# Patient Record
Sex: Male | Born: 1950 | Race: White | Hispanic: No | Marital: Married | Smoking: Never smoker
Health system: Southern US, Community
[De-identification: ages and names within clinical notes are randomized; demographics above are authoritative.]

## PROBLEM LIST (undated history)

## (undated) DIAGNOSIS — E785 Hyperlipidemia, unspecified: Secondary | ICD-10-CM

## (undated) DIAGNOSIS — M109 Gout, unspecified: Secondary | ICD-10-CM

## (undated) DIAGNOSIS — K219 Gastro-esophageal reflux disease without esophagitis: Secondary | ICD-10-CM

## (undated) DIAGNOSIS — K635 Polyp of colon: Secondary | ICD-10-CM

## (undated) DIAGNOSIS — M1990 Unspecified osteoarthritis, unspecified site: Secondary | ICD-10-CM

## (undated) DIAGNOSIS — B029 Zoster without complications: Secondary | ICD-10-CM

## (undated) DIAGNOSIS — E7800 Pure hypercholesterolemia, unspecified: Secondary | ICD-10-CM

## (undated) HISTORY — PX: KNEE SURGERY: SHX244

## (undated) HISTORY — PX: COSMETIC SURGERY: SHX468

## (undated) HISTORY — DX: Gout, unspecified: M10.9

## (undated) HISTORY — PX: SHOULDER ARTHROSCOPY: SHX128

## (undated) HISTORY — PX: TONSILLECTOMY: SUR1361

## (undated) HISTORY — DX: Zoster without complications: B02.9

## (undated) HISTORY — DX: Polyp of colon: K63.5

## (undated) HISTORY — DX: Gastro-esophageal reflux disease without esophagitis: K21.9

## (undated) HISTORY — PX: OTHER SURGICAL HISTORY: SHX169

## (undated) HISTORY — DX: Pure hypercholesterolemia, unspecified: E78.00

## (undated) HISTORY — DX: Unspecified osteoarthritis, unspecified site: M19.90

## (undated) HISTORY — PX: KNEE ARTHROSCOPY: SUR90

## (undated) HISTORY — DX: Hyperlipidemia, unspecified: E78.5

---

## 2003-12-20 ENCOUNTER — Ambulatory Visit (HOSPITAL_BASED_OUTPATIENT_CLINIC_OR_DEPARTMENT_OTHER): Admission: RE | Admit: 2003-12-20 | Discharge: 2003-12-20 | Payer: Self-pay | Admitting: Orthopedic Surgery

## 2003-12-20 ENCOUNTER — Ambulatory Visit (HOSPITAL_COMMUNITY): Admission: RE | Admit: 2003-12-20 | Discharge: 2003-12-20 | Payer: Self-pay | Admitting: Orthopedic Surgery

## 2004-08-27 ENCOUNTER — Encounter: Admission: RE | Admit: 2004-08-27 | Discharge: 2004-08-27 | Payer: Self-pay | Admitting: Family Medicine

## 2005-06-11 ENCOUNTER — Encounter: Admission: RE | Admit: 2005-06-11 | Discharge: 2005-06-11 | Payer: Self-pay | Admitting: Family Medicine

## 2006-05-07 ENCOUNTER — Encounter: Admission: RE | Admit: 2006-05-07 | Discharge: 2006-05-07 | Payer: Self-pay | Admitting: Family Medicine

## 2006-06-07 ENCOUNTER — Encounter: Admission: RE | Admit: 2006-06-07 | Discharge: 2006-06-07 | Payer: Self-pay | Admitting: Family Medicine

## 2006-06-09 ENCOUNTER — Encounter: Admission: RE | Admit: 2006-06-09 | Discharge: 2006-06-09 | Payer: Self-pay | Admitting: Family Medicine

## 2008-01-06 ENCOUNTER — Encounter: Admission: RE | Admit: 2008-01-06 | Discharge: 2008-01-06 | Payer: Self-pay | Admitting: Internal Medicine

## 2008-06-26 ENCOUNTER — Encounter: Admission: RE | Admit: 2008-06-26 | Discharge: 2008-06-26 | Payer: Self-pay | Admitting: Internal Medicine

## 2008-07-02 ENCOUNTER — Encounter: Admission: RE | Admit: 2008-07-02 | Discharge: 2008-07-02 | Payer: Self-pay | Admitting: Internal Medicine

## 2008-07-16 ENCOUNTER — Encounter: Admission: RE | Admit: 2008-07-16 | Discharge: 2008-07-16 | Payer: Self-pay | Admitting: Internal Medicine

## 2009-08-01 ENCOUNTER — Encounter: Admission: RE | Admit: 2009-08-01 | Discharge: 2009-08-01 | Payer: Self-pay | Admitting: Sports Medicine

## 2011-03-06 NOTE — Op Note (Signed)
NAME:  Dylan Cortez, Dylan Cortez NO.:  [id number]   MEDICAL RECORD NO.:  [id number]                   PATIENT TYPE:  AMB   LOCATION:  DSC                                  FACILITY:  [facility]   PHYSICIAN:  Nadara Mustard, M.D.                DATE OF BIRTH:  [birth year]   DATE OF PROCEDURE:  12/20/2003  DATE OF DISCHARGE:                                 OPERATIVE REPORT   PREOPERATIVE DIAGNOSES:  1. Left shoulder rotator cuff tear.  2. Impingement syndrome, left shoulder, with hook type 3 acromion.   POSTOPERATIVE DIAGNOSES:  1. Left shoulder rotator cuff tear.  2. Impingement syndrome, left shoulder, with hook type 3 acromion.   PROCEDURE:  1. Left shoulder arthroscopy with subacromial decompression.  2. Debridement of rotator cuff tear.   SURGEON:  Nadara Mustard, M.D.   ANESTHESIA:  Interscalene block plus general endotracheal anesthesia.   ESTIMATED BLOOD LOSS:  Minimal.   ANTIBIOTICS:  None.   DRAINS:  None.   DRAINS:  None.   COMPLICATIONS:  None.   DISPOSITION:  To PACU in stable condition.   INDICATIONS:  The patient is a 60 year old gentleman with impingement  symptoms of his left shoulder.  The patient has failed conservative care  including subacromial injections, anti-inflammatories, and activity  modification.  The patient is unable to perform his activities of daily  living without pain and wished to proceed with arthroscopic intervention.  The risks and benefits were discussed including infection, neurovascular  injury, persistent pain, need for additional surgery.  The patient states he  understands and wishes to proceed at this time.   DESCRIPTION OF PROCEDURE:  The patient was brought to operating room #5 and  underwent a general anesthetic.  The patient also had an interscalene block.  After an adequate level of anesthesia was obtained, the patient was placed  in a beach chair position and his left upper extremity was prepped  using  DuraPrep and draped in a sterile field.  The scope was inserted from the  posterior portal.  An anterior portal was established from the outside in  technique with an 18 gauge spinal.  Visualization showed approximately a 10  x 10 mm rotator cuff tear just superior to the glenoid.  This was debrided  with a shaver.  The vapor Mitek was used for hemostasis.  The patient also  had a grade I slap lesion which was also debrided and had a good insertion  with no fraying and no rotator cuff pathology at the insertion of the  rotator cuff.  The patient's glenohumeral joint was intact, and the patient  had a stable glenohumeral ligament.  After debridement, vapor Mitek was used  for hemostasis.  Articular cartilage was intact and the vapor Mitek was not  used to attach to the cartilage.  The instruments were removed.  The scope  was then inserted from  the posterior portal in the subacromial space and a  lateral portal was established.  A subacromial decompression was performed  and a debridement of the rotator cuff was again performed from the superior  aspect of the rotator cuff.  The hole was circumferentially approximately 10  x 10 mm in diameter.  There was no other rotator cuff pathology.  Vapor  Mitek was used for hemostasis.  After subacromial decompression, the  instruments were removed.  The portals were closed using 3-0 nylon.  The  wounds were covered with Adaptic, orthopedic sponges, ABD dressing and  Hypafix tape.  The patient was then extubated and taken to the PACU in  stable condition.  Plans for range of motion exercises, change dressing in  three days, plan to follow up in the office in 2 weeks.                                               Nadara Mustard, M.D.    MVD/MEDQ  D:  12/20/2003  T:  12/21/2003  Job:  [id number]

## 2013-06-15 ENCOUNTER — Encounter: Payer: Self-pay | Admitting: Internal Medicine

## 2013-08-02 ENCOUNTER — Ambulatory Visit (AMBULATORY_SURGERY_CENTER): Payer: Self-pay

## 2013-08-02 VITALS — Ht 71.5 in | Wt 180.0 lb

## 2013-08-02 DIAGNOSIS — Z860100 Personal history of colon polyps, unspecified: Secondary | ICD-10-CM

## 2013-08-02 MED ORDER — MOVIPREP 100 G PO SOLR: 1.0000 | Freq: Once | ORAL | Status: DC

## 2013-08-16 ENCOUNTER — Ambulatory Visit (AMBULATORY_SURGERY_CENTER): Admitting: Internal Medicine

## 2013-08-16 ENCOUNTER — Encounter: Payer: Self-pay | Admitting: Internal Medicine

## 2013-08-16 VITALS — BP 99/66 | HR 54 | Temp 96.9°F | Resp 17 | Ht 71.0 in | Wt 180.0 lb

## 2013-08-16 DIAGNOSIS — Z1211 Encounter for screening for malignant neoplasm of colon: Secondary | ICD-10-CM

## 2013-08-16 DIAGNOSIS — D126 Benign neoplasm of colon, unspecified: Secondary | ICD-10-CM

## 2013-08-16 DIAGNOSIS — Z86010 Personal history of colonic polyps: Secondary | ICD-10-CM

## 2013-08-16 MED ORDER — SODIUM CHLORIDE 0.9 % IV SOLN: 500.0000 mL | INTRAVENOUS | Status: DC

## 2013-08-16 NOTE — Progress Notes (Signed)
Procedure ends, to recovery, report given and VSS. 

## 2013-08-16 NOTE — Op Note (Signed)
[facility] [address] [place], [address]   COLONOSCOPY PROCEDURE REPORT  PATIENT: Dylan, Cortez  MR#: [id number] BIRTHDATE: September 18, 1951 , 62  yrs. old GENDER: Male ENDOSCOPIST: Beverley Fiedler, MD REFERRED BY:W.  Buren Kos, M.D. PROCEDURE DATE:  08/16/2013 PROCEDURE:   Colonoscopy with cold biopsy polypectomy and Colonoscopy with snare polypectomy First Screening Colonoscopy - Avg.  risk and is 50 yrs.  old or older Yes.  Prior Negative Screening - Now for repeat screening. N/A  History of Adenoma - Now for follow-up colonoscopy & has been > or = to 3 yrs.  N/A  Polyps Removed Today? Yes. ASA CLASS:   Class II INDICATIONS:average risk screening. MEDICATIONS: MAC sedation, administered by CRNA  DESCRIPTION OF PROCEDURE:   After the risks benefits and alternatives of the procedure were thoroughly explained, informed consent was obtained.  A digital rectal exam revealed no rectal mass.   The LB [id number]  endoscope was introduced through the anus and advanced to the cecum, which was identified by both the appendix and ileocecal valve. No adverse events experienced. The quality of the prep was good, using MoviPrep  The instrument was then slowly withdrawn as the colon was fully examined.  COLON FINDINGS: A sessile polyp measuring 2 mm in size was found at the cecum.  A polypectomy was performed with cold forceps.  The resection was complete and the polyp tissue was completely retrieved.   A sessile polyp measuring 5 mm in size was found in the ascending colon.  A polypectomy was performed with a cold snare.  The resection was complete and the polyp tissue was completely retrieved.   Mild diverticulosis was noted at the hepatic flexure, in the transverse colon, and descending colon. Retroflexed views revealed no abnormalities. The time to cecum=3 minutes 24 seconds.  Withdrawal time=9 minutes 04 seconds.  The scope was withdrawn and the procedure  completed.  COMPLICATIONS: There were no complications.  ENDOSCOPIC IMPRESSION: 1.   Sessile polyp measuring 2 mm in size was found at the cecum; polypectomy was performed with cold forceps 2.   Sessile polyp measuring 5 mm in size was found in the ascending colon; polypectomy was performed with a cold snare 3.   Mild diverticulosis was noted at the hepatic flexure, in the transverse colon, and descending colon  RECOMMENDATIONS: 1.  Await pathology results 2.  High fiber diet 3.  If the polyps removed today are proven to be adenomatous (pre-cancerous) polyps, you will need a repeat colonoscopy in 5 years.  Otherwise you should continue to follow colorectal cancer screening guidelines for "routine risk" patients with colonoscopy in 10 years.  You will receive a letter within 1-2 weeks with the results of your biopsy as well as final recommendations.  Please call my office if you have not received a letter after 3 weeks.   eSigned:  Beverley Fiedler, MD 08/16/2013 10:11 AM   cc: The Patient and W.  Buren Kos, MD   PATIENT NAME:  Dylan, Cortez MR#: [id number]

## 2013-08-16 NOTE — Progress Notes (Signed)
Patient did not experience any of the following events: a burn prior to discharge; a fall within the facility; wrong site/side/patient/procedure/implant event; or a hospital transfer or hospital admission upon discharge from the facility. (G8907) Patient did not have preoperative order for IV antibiotic SSI prophylaxis. (G8918)  

## 2013-08-16 NOTE — Progress Notes (Signed)
Called to room to assist during endoscopic procedure.  Patient ID and intended procedure confirmed with present staff. Received instructions for my participation in the procedure from the performing physician. ewm 

## 2013-08-16 NOTE — Patient Instructions (Signed)
YOU HAD AN ENDOSCOPIC PROCEDURE TODAY AT THE [facility]: Refer to the procedure report that was given to you for any specific questions about what was found during the examination.  If the procedure report does not answer your questions, please call your gastroenterologist to clarify.  If you requested that your care partner not be given the details of your procedure findings, then the procedure report has been included in a sealed envelope for you to review at your convenience later.  YOU SHOULD EXPECT: Some feelings of bloating in the abdomen. Passage of more gas than usual.  Walking can help get rid of the air that was put into your GI tract during the procedure and reduce the bloating. If you had a lower endoscopy (such as a colonoscopy or flexible sigmoidoscopy) you may notice spotting of blood in your stool or on the toilet paper. If you underwent a bowel prep for your procedure, then you may not have a normal bowel movement for a few days.  DIET: Your first meal following the procedure should be a light meal and then it is ok to progress to your normal diet.  A half-sandwich or bowl of soup is an example of a good first meal.  Heavy or fried foods are harder to digest and may make you feel nauseous or bloated.  Likewise meals heavy in dairy and vegetables can cause extra gas to form and this can also increase the bloating.  Drink plenty of fluids but you should avoid alcoholic beverages for 24 hours.  ACTIVITY: Your care partner should take you home directly after the procedure.  You should plan to take it easy, moving slowly for the rest of the day.  You can resume normal activity the day after the procedure however you should NOT DRIVE or use heavy machinery for 24 hours (because of the sedation medicines used during the test).    SYMPTOMS TO REPORT IMMEDIATELY: A gastroenterologist can be reached at any hour.  During normal business hours, 8:30 AM to 5:00 PM Monday through Friday,  call [phone].  After hours and on weekends, please call the GI answering service at [phone] who will take a message and have the physician on call contact you.   Following lower endoscopy (colonoscopy or flexible sigmoidoscopy):  Excessive amounts of blood in the stool  Significant tenderness or worsening of abdominal pains  Swelling of the abdomen that is new, acute  Fever of 100F or higher   FOLLOW UP: If any biopsies were taken you will be contacted by phone or by letter within the next 1-3 weeks.  Call your gastroenterologist if you have not heard about the biopsies in 3 weeks.  Our staff will call the home number listed on your records the next business day following your procedure to check on you and address any questions or concerns that you may have at that time regarding the information given to you following your procedure. This is a courtesy call and so if there is no answer at the home number and we have not heard from you through the emergency physician on call, we will assume that you have returned to your regular daily activities without incident.  SIGNATURES/CONFIDENTIALITY: You and/or your care partner have signed paperwork which will be entered into your electronic medical record.  These signatures attest to the fact that that the information above on your After Visit Summary has been reviewed and is understood.  Full responsibility of the confidentiality of   this discharge information lies with you and/or your care-partner.   Resume medications. Information given on polyps, Diverticulosis and high fiber diet with discharge instructions. 

## 2013-08-17 ENCOUNTER — Telehealth: Payer: Self-pay | Admitting: *Deleted

## 2013-08-17 NOTE — Telephone Encounter (Signed)
  Follow up Call-  Call back number 08/16/2013  Post procedure Call Back phone  # [phone]  Permission to leave phone message Yes     Patient questions:  Do you have a fever, pain , or abdominal swelling? no Pain Score  0 *  Have you tolerated food without any problems? yes  Have you been able to return to your normal activities? yes  Do you have any questions about your discharge instructions: Diet   no Medications  no Follow up visit  no  Do you have questions or concerns about your Care? no  Actions: * If pain score is 4 or above: No action needed, pain <4.

## 2013-08-21 ENCOUNTER — Encounter: Payer: Self-pay | Admitting: Internal Medicine

## 2013-09-18 ENCOUNTER — Encounter (INDEPENDENT_AMBULATORY_CARE_PROVIDER_SITE_OTHER): Payer: TRICARE For Life (TFL) | Admitting: Ophthalmology

## 2013-09-18 DIAGNOSIS — H2510 Age-related nuclear cataract, unspecified eye: Secondary | ICD-10-CM

## 2013-09-18 DIAGNOSIS — H43819 Vitreous degeneration, unspecified eye: Secondary | ICD-10-CM

## 2013-09-18 DIAGNOSIS — H33309 Unspecified retinal break, unspecified eye: Secondary | ICD-10-CM

## 2013-09-27 ENCOUNTER — Ambulatory Visit (INDEPENDENT_AMBULATORY_CARE_PROVIDER_SITE_OTHER): Payer: TRICARE For Life (TFL) | Admitting: Ophthalmology

## 2013-09-27 DIAGNOSIS — H33309 Unspecified retinal break, unspecified eye: Secondary | ICD-10-CM

## 2014-02-01 ENCOUNTER — Ambulatory Visit (INDEPENDENT_AMBULATORY_CARE_PROVIDER_SITE_OTHER): Payer: TRICARE For Life (TFL) | Admitting: Ophthalmology

## 2014-02-01 DIAGNOSIS — H2510 Age-related nuclear cataract, unspecified eye: Secondary | ICD-10-CM

## 2014-02-01 DIAGNOSIS — H43819 Vitreous degeneration, unspecified eye: Secondary | ICD-10-CM

## 2014-02-01 DIAGNOSIS — H33309 Unspecified retinal break, unspecified eye: Secondary | ICD-10-CM

## 2015-10-23 DIAGNOSIS — M7711 Lateral epicondylitis, right elbow: Secondary | ICD-10-CM | POA: Diagnosis not present

## 2015-10-23 DIAGNOSIS — M25521 Pain in right elbow: Secondary | ICD-10-CM | POA: Diagnosis not present

## 2015-10-23 DIAGNOSIS — M778 Other enthesopathies, not elsewhere classified: Secondary | ICD-10-CM | POA: Diagnosis not present

## 2015-10-28 DIAGNOSIS — M25521 Pain in right elbow: Secondary | ICD-10-CM | POA: Diagnosis not present

## 2015-10-30 DIAGNOSIS — M7711 Lateral epicondylitis, right elbow: Secondary | ICD-10-CM | POA: Diagnosis not present

## 2015-11-05 DIAGNOSIS — M25521 Pain in right elbow: Secondary | ICD-10-CM | POA: Diagnosis not present

## 2015-11-07 DIAGNOSIS — M25521 Pain in right elbow: Secondary | ICD-10-CM | POA: Diagnosis not present

## 2015-11-12 DIAGNOSIS — M25521 Pain in right elbow: Secondary | ICD-10-CM | POA: Diagnosis not present

## 2015-11-14 DIAGNOSIS — M25521 Pain in right elbow: Secondary | ICD-10-CM | POA: Diagnosis not present

## 2015-11-27 DIAGNOSIS — M25521 Pain in right elbow: Secondary | ICD-10-CM | POA: Diagnosis not present

## 2015-11-27 DIAGNOSIS — M7711 Lateral epicondylitis, right elbow: Secondary | ICD-10-CM | POA: Diagnosis not present

## 2015-11-27 DIAGNOSIS — M778 Other enthesopathies, not elsewhere classified: Secondary | ICD-10-CM | POA: Diagnosis not present

## 2016-07-06 DIAGNOSIS — Z125 Encounter for screening for malignant neoplasm of prostate: Secondary | ICD-10-CM | POA: Diagnosis not present

## 2016-07-06 DIAGNOSIS — M109 Gout, unspecified: Secondary | ICD-10-CM | POA: Diagnosis not present

## 2016-07-06 DIAGNOSIS — E784 Other hyperlipidemia: Secondary | ICD-10-CM | POA: Diagnosis not present

## 2016-07-06 DIAGNOSIS — R7301 Impaired fasting glucose: Secondary | ICD-10-CM | POA: Diagnosis not present

## 2016-07-10 DIAGNOSIS — Z6827 Body mass index (BMI) 27.0-27.9, adult: Secondary | ICD-10-CM | POA: Diagnosis not present

## 2016-07-10 DIAGNOSIS — R7301 Impaired fasting glucose: Secondary | ICD-10-CM | POA: Diagnosis not present

## 2016-07-10 DIAGNOSIS — Z86010 Personal history of colonic polyps: Secondary | ICD-10-CM | POA: Diagnosis not present

## 2016-07-10 DIAGNOSIS — Z23 Encounter for immunization: Secondary | ICD-10-CM | POA: Diagnosis not present

## 2016-07-10 DIAGNOSIS — E782 Mixed hyperlipidemia: Secondary | ICD-10-CM | POA: Diagnosis not present

## 2016-07-10 DIAGNOSIS — M7712 Lateral epicondylitis, left elbow: Secondary | ICD-10-CM | POA: Diagnosis not present

## 2016-07-10 DIAGNOSIS — N529 Male erectile dysfunction, unspecified: Secondary | ICD-10-CM | POA: Diagnosis not present

## 2016-07-10 DIAGNOSIS — R0609 Other forms of dyspnea: Secondary | ICD-10-CM | POA: Diagnosis not present

## 2016-07-10 DIAGNOSIS — K219 Gastro-esophageal reflux disease without esophagitis: Secondary | ICD-10-CM | POA: Diagnosis not present

## 2016-07-10 DIAGNOSIS — Z0000 Encounter for general adult medical examination without abnormal findings: Secondary | ICD-10-CM | POA: Diagnosis not present

## 2016-07-13 ENCOUNTER — Other Ambulatory Visit: Payer: Self-pay | Admitting: Internal Medicine

## 2016-07-13 DIAGNOSIS — Z0000 Encounter for general adult medical examination without abnormal findings: Secondary | ICD-10-CM

## 2016-07-13 DIAGNOSIS — Z1212 Encounter for screening for malignant neoplasm of rectum: Secondary | ICD-10-CM | POA: Diagnosis not present

## 2016-07-29 ENCOUNTER — Ambulatory Visit
Admission: RE | Admit: 2016-07-29 | Discharge: 2016-07-29 | Disposition: A | Discharge status: Home / Self Care | Payer: Medicare Other | Source: Ambulatory Visit | Attending: Internal Medicine | Admitting: Internal Medicine

## 2016-07-29 DIAGNOSIS — Z87891 Personal history of nicotine dependence: Secondary | ICD-10-CM | POA: Diagnosis not present

## 2016-07-29 DIAGNOSIS — Z0000 Encounter for general adult medical examination without abnormal findings: Secondary | ICD-10-CM

## 2016-07-29 DIAGNOSIS — Z136 Encounter for screening for cardiovascular disorders: Secondary | ICD-10-CM | POA: Diagnosis not present

## 2016-11-17 DIAGNOSIS — L821 Other seborrheic keratosis: Secondary | ICD-10-CM | POA: Diagnosis not present

## 2016-11-17 DIAGNOSIS — L570 Actinic keratosis: Secondary | ICD-10-CM | POA: Diagnosis not present

## 2016-11-17 DIAGNOSIS — D1801 Hemangioma of skin and subcutaneous tissue: Secondary | ICD-10-CM | POA: Diagnosis not present

## 2016-11-17 DIAGNOSIS — L820 Inflamed seborrheic keratosis: Secondary | ICD-10-CM | POA: Diagnosis not present

## 2016-11-17 DIAGNOSIS — L814 Other melanin hyperpigmentation: Secondary | ICD-10-CM | POA: Diagnosis not present

## 2017-07-14 DIAGNOSIS — E782 Mixed hyperlipidemia: Secondary | ICD-10-CM | POA: Diagnosis not present

## 2017-07-14 DIAGNOSIS — Z0000 Encounter for general adult medical examination without abnormal findings: Secondary | ICD-10-CM | POA: Diagnosis not present

## 2017-07-14 DIAGNOSIS — Z125 Encounter for screening for malignant neoplasm of prostate: Secondary | ICD-10-CM | POA: Diagnosis not present

## 2017-07-14 DIAGNOSIS — R7301 Impaired fasting glucose: Secondary | ICD-10-CM | POA: Diagnosis not present

## 2017-07-14 DIAGNOSIS — M109 Gout, unspecified: Secondary | ICD-10-CM | POA: Diagnosis not present

## 2017-07-20 DIAGNOSIS — E782 Mixed hyperlipidemia: Secondary | ICD-10-CM | POA: Diagnosis not present

## 2017-07-20 DIAGNOSIS — Z1389 Encounter for screening for other disorder: Secondary | ICD-10-CM | POA: Diagnosis not present

## 2017-07-20 DIAGNOSIS — Z86010 Personal history of colonic polyps: Secondary | ICD-10-CM | POA: Diagnosis not present

## 2017-07-20 DIAGNOSIS — M109 Gout, unspecified: Secondary | ICD-10-CM | POA: Diagnosis not present

## 2017-07-20 DIAGNOSIS — R7301 Impaired fasting glucose: Secondary | ICD-10-CM | POA: Diagnosis not present

## 2017-07-20 DIAGNOSIS — K219 Gastro-esophageal reflux disease without esophagitis: Secondary | ICD-10-CM | POA: Diagnosis not present

## 2017-07-20 DIAGNOSIS — R0609 Other forms of dyspnea: Secondary | ICD-10-CM | POA: Diagnosis not present

## 2017-07-20 DIAGNOSIS — Z23 Encounter for immunization: Secondary | ICD-10-CM | POA: Diagnosis not present

## 2017-07-20 DIAGNOSIS — Z6826 Body mass index (BMI) 26.0-26.9, adult: Secondary | ICD-10-CM | POA: Diagnosis not present

## 2017-07-20 DIAGNOSIS — Z0000 Encounter for general adult medical examination without abnormal findings: Secondary | ICD-10-CM | POA: Diagnosis not present

## 2017-07-21 ENCOUNTER — Telehealth: Payer: Self-pay

## 2017-07-21 DIAGNOSIS — Z1212 Encounter for screening for malignant neoplasm of rectum: Secondary | ICD-10-CM | POA: Diagnosis not present

## 2017-07-21 NOTE — Telephone Encounter (Signed)
NOTES SENT TO SCHEDULING.  °

## 2017-08-05 ENCOUNTER — Ambulatory Visit (INDEPENDENT_AMBULATORY_CARE_PROVIDER_SITE_OTHER): Payer: Medicare Other

## 2017-08-05 DIAGNOSIS — R0609 Other forms of dyspnea: Secondary | ICD-10-CM

## 2017-08-05 LAB — EXERCISE TOLERANCE TEST
CHL CUP MPHR: 154 {beats}/min
CHL CUP RESTING HR STRESS: 68 {beats}/min
CSEPHR: 90 %
CSEPPHR: 139 {beats}/min
Estimated workload: 10.3 METS
Exercise duration (min): 9 min
Exercise duration (sec): 11 s
RPE: 17

## 2017-12-02 DIAGNOSIS — D225 Melanocytic nevi of trunk: Secondary | ICD-10-CM | POA: Diagnosis not present

## 2017-12-02 DIAGNOSIS — D1801 Hemangioma of skin and subcutaneous tissue: Secondary | ICD-10-CM | POA: Diagnosis not present

## 2017-12-02 DIAGNOSIS — L812 Freckles: Secondary | ICD-10-CM | POA: Diagnosis not present

## 2017-12-02 DIAGNOSIS — L821 Other seborrheic keratosis: Secondary | ICD-10-CM | POA: Diagnosis not present

## 2017-12-02 DIAGNOSIS — L720 Epidermal cyst: Secondary | ICD-10-CM | POA: Diagnosis not present

## 2017-12-02 DIAGNOSIS — L570 Actinic keratosis: Secondary | ICD-10-CM | POA: Diagnosis not present

## 2017-12-02 DIAGNOSIS — D485 Neoplasm of uncertain behavior of skin: Secondary | ICD-10-CM | POA: Diagnosis not present

## 2017-12-02 DIAGNOSIS — C44519 Basal cell carcinoma of skin of other part of trunk: Secondary | ICD-10-CM | POA: Diagnosis not present

## 2018-01-24 DIAGNOSIS — M25561 Pain in right knee: Secondary | ICD-10-CM | POA: Diagnosis not present

## 2018-04-28 DIAGNOSIS — M25561 Pain in right knee: Secondary | ICD-10-CM | POA: Diagnosis not present

## 2018-04-29 ENCOUNTER — Other Ambulatory Visit: Payer: Self-pay | Admitting: Physician Assistant

## 2018-04-29 DIAGNOSIS — M25561 Pain in right knee: Secondary | ICD-10-CM

## 2018-04-30 ENCOUNTER — Ambulatory Visit
Admission: RE | Admit: 2018-04-30 | Discharge: 2018-04-30 | Disposition: A | Discharge status: Home / Self Care | Payer: Medicare Other | Source: Ambulatory Visit | Attending: Physician Assistant | Admitting: Physician Assistant

## 2018-04-30 DIAGNOSIS — M25461 Effusion, right knee: Secondary | ICD-10-CM | POA: Diagnosis not present

## 2018-04-30 DIAGNOSIS — M25561 Pain in right knee: Secondary | ICD-10-CM

## 2018-05-03 ENCOUNTER — Other Ambulatory Visit: Payer: Self-pay | Admitting: Physician Assistant

## 2018-05-05 DIAGNOSIS — S83241A Other tear of medial meniscus, current injury, right knee, initial encounter: Secondary | ICD-10-CM | POA: Diagnosis not present

## 2018-05-05 DIAGNOSIS — M25561 Pain in right knee: Secondary | ICD-10-CM | POA: Diagnosis not present

## 2018-05-27 ENCOUNTER — Encounter: Payer: Self-pay | Admitting: Internal Medicine

## 2018-06-06 ENCOUNTER — Encounter: Payer: Self-pay | Admitting: Internal Medicine

## 2018-06-06 DIAGNOSIS — G8918 Other acute postprocedural pain: Secondary | ICD-10-CM | POA: Diagnosis not present

## 2018-06-06 DIAGNOSIS — M659 Synovitis and tenosynovitis, unspecified: Secondary | ICD-10-CM | POA: Diagnosis not present

## 2018-06-06 DIAGNOSIS — M23321 Other meniscus derangements, posterior horn of medial meniscus, right knee: Secondary | ICD-10-CM | POA: Diagnosis not present

## 2018-06-06 DIAGNOSIS — M23221 Derangement of posterior horn of medial meniscus due to old tear or injury, right knee: Secondary | ICD-10-CM | POA: Diagnosis not present

## 2018-06-06 DIAGNOSIS — M94261 Chondromalacia, right knee: Secondary | ICD-10-CM | POA: Diagnosis not present

## 2018-06-06 DIAGNOSIS — M6751 Plica syndrome, right knee: Secondary | ICD-10-CM | POA: Diagnosis not present

## 2018-06-13 DIAGNOSIS — L812 Freckles: Secondary | ICD-10-CM | POA: Diagnosis not present

## 2018-06-13 DIAGNOSIS — Z85828 Personal history of other malignant neoplasm of skin: Secondary | ICD-10-CM | POA: Diagnosis not present

## 2018-06-13 DIAGNOSIS — L821 Other seborrheic keratosis: Secondary | ICD-10-CM | POA: Diagnosis not present

## 2018-06-13 DIAGNOSIS — L570 Actinic keratosis: Secondary | ICD-10-CM | POA: Diagnosis not present

## 2018-07-13 DIAGNOSIS — Z5189 Encounter for other specified aftercare: Secondary | ICD-10-CM | POA: Diagnosis not present

## 2018-07-13 DIAGNOSIS — M25561 Pain in right knee: Secondary | ICD-10-CM | POA: Diagnosis not present

## 2018-07-13 DIAGNOSIS — Z9889 Other specified postprocedural states: Secondary | ICD-10-CM | POA: Diagnosis not present

## 2018-07-27 DIAGNOSIS — M109 Gout, unspecified: Secondary | ICD-10-CM | POA: Diagnosis not present

## 2018-07-27 DIAGNOSIS — R7301 Impaired fasting glucose: Secondary | ICD-10-CM | POA: Diagnosis not present

## 2018-07-27 DIAGNOSIS — E782 Mixed hyperlipidemia: Secondary | ICD-10-CM | POA: Diagnosis not present

## 2018-07-27 DIAGNOSIS — Z125 Encounter for screening for malignant neoplasm of prostate: Secondary | ICD-10-CM | POA: Diagnosis not present

## 2018-07-27 DIAGNOSIS — R82998 Other abnormal findings in urine: Secondary | ICD-10-CM | POA: Diagnosis not present

## 2018-08-03 DIAGNOSIS — M109 Gout, unspecified: Secondary | ICD-10-CM | POA: Diagnosis not present

## 2018-08-03 DIAGNOSIS — E782 Mixed hyperlipidemia: Secondary | ICD-10-CM | POA: Diagnosis not present

## 2018-08-03 DIAGNOSIS — K219 Gastro-esophageal reflux disease without esophagitis: Secondary | ICD-10-CM | POA: Diagnosis not present

## 2018-08-03 DIAGNOSIS — R7301 Impaired fasting glucose: Secondary | ICD-10-CM | POA: Diagnosis not present

## 2018-08-03 DIAGNOSIS — R490 Dysphonia: Secondary | ICD-10-CM | POA: Diagnosis not present

## 2018-08-03 DIAGNOSIS — Z0000 Encounter for general adult medical examination without abnormal findings: Secondary | ICD-10-CM | POA: Diagnosis not present

## 2018-08-03 DIAGNOSIS — Z86010 Personal history of colonic polyps: Secondary | ICD-10-CM | POA: Diagnosis not present

## 2018-08-03 DIAGNOSIS — Z125 Encounter for screening for malignant neoplasm of prostate: Secondary | ICD-10-CM | POA: Diagnosis not present

## 2018-08-03 DIAGNOSIS — Z23 Encounter for immunization: Secondary | ICD-10-CM | POA: Diagnosis not present

## 2018-08-03 DIAGNOSIS — Z1389 Encounter for screening for other disorder: Secondary | ICD-10-CM | POA: Diagnosis not present

## 2018-08-03 DIAGNOSIS — Z6826 Body mass index (BMI) 26.0-26.9, adult: Secondary | ICD-10-CM | POA: Diagnosis not present

## 2018-08-04 ENCOUNTER — Ambulatory Visit (AMBULATORY_SURGERY_CENTER): Payer: Self-pay

## 2018-08-04 VITALS — Ht 71.5 in | Wt 191.6 lb

## 2018-08-04 DIAGNOSIS — Z86010 Personal history of colonic polyps: Secondary | ICD-10-CM

## 2018-08-04 DIAGNOSIS — Z1212 Encounter for screening for malignant neoplasm of rectum: Secondary | ICD-10-CM | POA: Diagnosis not present

## 2018-08-04 MED ORDER — NA SULFATE-K SULFATE-MG SULF 17.5-3.13-1.6 GM/177ML PO SOLN: 1.0000 | Freq: Once | ORAL | 0 refills | Status: AC

## 2018-08-04 NOTE — Progress Notes (Signed)
Denies allergies to eggs or soy products. Denies complication of anesthesia or sedation. Denies use of weight loss medication. Denies use of O2.   Emmi instructions declined.  

## 2018-08-18 ENCOUNTER — Ambulatory Visit (AMBULATORY_SURGERY_CENTER): Payer: Medicare Other | Admitting: Internal Medicine

## 2018-08-18 ENCOUNTER — Encounter: Payer: Self-pay | Admitting: Internal Medicine

## 2018-08-18 VITALS — BP 106/75 | HR 56 | Temp 96.9°F | Resp 12 | Ht 71.0 in | Wt 191.0 lb

## 2018-08-18 DIAGNOSIS — K219 Gastro-esophageal reflux disease without esophagitis: Secondary | ICD-10-CM | POA: Diagnosis not present

## 2018-08-18 DIAGNOSIS — D120 Benign neoplasm of cecum: Secondary | ICD-10-CM

## 2018-08-18 DIAGNOSIS — Z86010 Personal history of colonic polyps: Secondary | ICD-10-CM | POA: Diagnosis not present

## 2018-08-18 DIAGNOSIS — D122 Benign neoplasm of ascending colon: Secondary | ICD-10-CM | POA: Diagnosis not present

## 2018-08-18 DIAGNOSIS — Z85038 Personal history of other malignant neoplasm of large intestine: Secondary | ICD-10-CM | POA: Diagnosis not present

## 2018-08-18 DIAGNOSIS — D123 Benign neoplasm of transverse colon: Secondary | ICD-10-CM

## 2018-08-18 DIAGNOSIS — E7800 Pure hypercholesterolemia, unspecified: Secondary | ICD-10-CM | POA: Diagnosis not present

## 2018-08-18 MED ORDER — SODIUM CHLORIDE 0.9 % IV SOLN: 500.0000 mL | Freq: Once | INTRAVENOUS | Status: DC

## 2018-08-18 NOTE — Progress Notes (Signed)
Pt's states no medical or surgical changes since previsit or office visit. 

## 2018-08-18 NOTE — Patient Instructions (Signed)
Impression/Recommendations:  Polyp handout given to patient. Diverticulosis handout given to patient. Hemorrhoid handout given to patient.  Resume previous diet. Continue present medications.  Await pathology results.  Repeat colonoscopy recommended for surveillance.  Date to be determined after pathology results reviewed.  YOU HAD AN ENDOSCOPIC PROCEDURE TODAY AT [facility]:   Refer to the procedure report that was given to you for any specific questions about what was found during the examination.  If the procedure report does not answer your questions, please call your gastroenterologist to clarify.  If you requested that your care partner not be given the details of your procedure findings, then the procedure report has been included in a sealed envelope for you to review at your convenience later.  YOU SHOULD EXPECT: Some feelings of bloating in the abdomen. Passage of more gas than usual.  Walking can help get rid of the air that was put into your GI tract during the procedure and reduce the bloating. If you had a lower endoscopy (such as a colonoscopy or flexible sigmoidoscopy) you may notice spotting of blood in your stool or on the toilet paper. If you underwent a bowel prep for your procedure, you may not have a normal bowel movement for a few days.  Please Note:  You might notice some irritation and congestion in your nose or some drainage.  This is from the oxygen used during your procedure.  There is no need for concern and it should clear up in a day or so.  SYMPTOMS TO REPORT IMMEDIATELY:   Following lower endoscopy (colonoscopy or flexible sigmoidoscopy):  Excessive amounts of blood in the stool  Significant tenderness or worsening of abdominal pains  Swelling of the abdomen that is new, acute  Fever of 100F or higher  For urgent or emergent issues, a gastroenterologist can be reached at any hour by calling [phone].   DIET:  We do recommend a  small meal at first, but then you may proceed to your regular diet.  Drink plenty of fluids but you should avoid alcoholic beverages for 24 hours.  ACTIVITY:  You should plan to take it easy for the rest of today and you should NOT DRIVE or use heavy machinery until tomorrow (because of the sedation medicines used during the test).    FOLLOW UP: Our staff will call the number listed on your records the next business day following your procedure to check on you and address any questions or concerns that you may have regarding the information given to you following your procedure. If we do not reach you, we will leave a message.  However, if you are feeling well and you are not experiencing any problems, there is no need to return our call.  We will assume that you have returned to your regular daily activities without incident.  If any biopsies were taken you will be contacted by phone or by letter within the next 1-3 weeks.  Please call us at [phone] if you have not heard about the biopsies in 3 weeks.    SIGNATURES/CONFIDENTIALITY: You and/or your care partner have signed paperwork which will be entered into your electronic medical record.  These signatures attest to the fact that that the information above on your After Visit Summary has been reviewed and is understood.  Full responsibility of the confidentiality of this discharge information lies with you and/or your care-partner.

## 2018-08-18 NOTE — Op Note (Signed)
[facility] Patient Name: Dylan Cortez Procedure Date: 08/18/2018 8:21 AM MRN: [id number] Endoscopist: Jerene Bears , MD Age: 67 Referring MD:  Date of Birth: Jul 15, 1951 Gender: Male Account #: [id number] Procedure:                Colonoscopy Indications:              Surveillance: Personal history of adenomatous                            polyps on last colonoscopy 5 years ago Medicines:                Monitored Anesthesia Care Procedure:                Pre-Anesthesia Assessment:                           - Prior to the procedure, a History and Physical                            was performed, and patient medications and                            allergies were reviewed. The patient's tolerance of                            previous anesthesia was also reviewed. The risks                            and benefits of the procedure and the sedation                            options and risks were discussed with the patient.                            All questions were answered, and informed consent                            was obtained. Prior Anticoagulants: The patient has                            taken no previous anticoagulant or antiplatelet                            agents. ASA Grade Assessment: II - A patient with                            mild systemic disease. After reviewing the risks                            and benefits, the patient was deemed in                            satisfactory condition to undergo the procedure.  After obtaining informed consent, the colonoscope                            was passed under direct vision. Throughout the                            procedure, the patient's blood pressure, pulse, and                            oxygen saturations were monitored continuously. The                            Colonoscope was introduced through the anus and                            advanced to the cecum,  identified by appendiceal                            orifice and ileocecal valve. The colonoscopy was                            performed without difficulty. The patient tolerated                            the procedure well. The quality of the bowel                            preparation was good. The ileocecal valve,                            appendiceal orifice, and rectum were photographed. Scope In: 8:27:26 AM Scope Out: 8:41:00 AM Scope Withdrawal Time: 0 hours 10 minutes 57 seconds  Total Procedure Duration: 0 hours 13 minutes 34 seconds  Findings:                 The digital rectal exam was normal.                           A 4 mm polyp was found in the cecum. The polyp was                            sessile. The polyp was removed with a cold snare.                            Resection and retrieval were complete.                           A 5 mm polyp was found in the ascending colon. The                            polyp was sessile. The polyp was removed with a  cold snare. Resection and retrieval were complete.                           A 4 mm polyp was found in the transverse colon. The                            polyp was sessile. The polyp was removed with a                            cold snare. Resection and retrieval were complete.                           Multiple small and large-mouthed diverticula were                            found in the descending colon, transverse colon and                            hepatic flexure.                           Internal hemorrhoids were found during                            retroflexion. The hemorrhoids were small. Complications:            No immediate complications. Estimated Blood Loss:     Estimated blood loss was minimal. Impression:               - One 4 mm polyp in the cecum, removed with a cold                            snare. Resected and retrieved.                           - One 5 mm  polyp in the ascending colon, removed                            with a cold snare. Resected and retrieved.                           - One 4 mm polyp in the transverse colon, removed                            with a cold snare. Resected and retrieved.                           - Diverticulosis in the descending colon, in the                            transverse colon and at the hepatic flexure.                           - Small hemorrhoids. Recommendation:           -  Patient has a contact number available for                            emergencies. The signs and symptoms of potential                            delayed complications were discussed with the                            patient. Return to normal activities tomorrow.                            Written discharge instructions were provided to the                            patient.                           - Resume previous diet.                           - Continue present medications.                           - Await pathology results.                           - Repeat colonoscopy is recommended for                            surveillance. The colonoscopy date will be                            determined after pathology results from today's                            exam become available for review. Jerene Bears, MD 08/18/2018 8:44:22 AM This report has been signed electronically.

## 2018-08-18 NOTE — Progress Notes (Signed)
Called to room to assist during endoscopic procedure.  Patient ID and intended procedure confirmed with present staff. Received instructions for my participation in the procedure from the performing physician.  

## 2018-08-18 NOTE — Progress Notes (Signed)
Report given to PACU, vss 

## 2018-08-19 ENCOUNTER — Telehealth: Payer: Self-pay | Admitting: *Deleted

## 2018-08-19 ENCOUNTER — Telehealth: Payer: Self-pay

## 2018-08-19 NOTE — Telephone Encounter (Signed)
  Follow up Call-  Call back number 08/18/2018  Post procedure Call Back phone  # [phone]  Permission to leave phone message Yes  Some recent data might be hidden     Patient questions:  Do you have a fever, pain , or abdominal swelling? No. Pain Score  0 *  Have you tolerated food without any problems? Yes.    Have you been able to return to your normal activities? Yes.    Do you have any questions about your discharge instructions: Diet   No. Medications  No. Follow up visit  No.  Do you have questions or concerns about your Care? No.  Actions: * If pain score is 4 or above: No action needed, pain <4.

## 2018-08-19 NOTE — Telephone Encounter (Signed)
Attempted to reach patient for post-procedure f/u call. No answer. Left message that we will make another attempt to reach him again later today and to please not hesitate to call us if he has any questions/concerns regarding his care.

## 2018-08-26 ENCOUNTER — Encounter: Payer: Self-pay | Admitting: Internal Medicine

## 2018-12-08 DIAGNOSIS — L821 Other seborrheic keratosis: Secondary | ICD-10-CM | POA: Diagnosis not present

## 2018-12-08 DIAGNOSIS — D1801 Hemangioma of skin and subcutaneous tissue: Secondary | ICD-10-CM | POA: Diagnosis not present

## 2018-12-08 DIAGNOSIS — L812 Freckles: Secondary | ICD-10-CM | POA: Diagnosis not present

## 2019-05-26 DIAGNOSIS — M25571 Pain in right ankle and joints of right foot: Secondary | ICD-10-CM | POA: Diagnosis not present

## 2019-07-01 DIAGNOSIS — Z23 Encounter for immunization: Secondary | ICD-10-CM | POA: Diagnosis not present

## 2019-08-03 DIAGNOSIS — M109 Gout, unspecified: Secondary | ICD-10-CM | POA: Diagnosis not present

## 2019-08-03 DIAGNOSIS — Z125 Encounter for screening for malignant neoplasm of prostate: Secondary | ICD-10-CM | POA: Diagnosis not present

## 2019-08-03 DIAGNOSIS — R7301 Impaired fasting glucose: Secondary | ICD-10-CM | POA: Diagnosis not present

## 2019-08-03 DIAGNOSIS — E782 Mixed hyperlipidemia: Secondary | ICD-10-CM | POA: Diagnosis not present

## 2019-08-03 DIAGNOSIS — R82998 Other abnormal findings in urine: Secondary | ICD-10-CM | POA: Diagnosis not present

## 2019-08-08 DIAGNOSIS — Z1212 Encounter for screening for malignant neoplasm of rectum: Secondary | ICD-10-CM | POA: Diagnosis not present

## 2019-08-10 DIAGNOSIS — R7301 Impaired fasting glucose: Secondary | ICD-10-CM | POA: Diagnosis not present

## 2019-08-10 DIAGNOSIS — M109 Gout, unspecified: Secondary | ICD-10-CM | POA: Diagnosis not present

## 2019-08-10 DIAGNOSIS — K219 Gastro-esophageal reflux disease without esophagitis: Secondary | ICD-10-CM | POA: Diagnosis not present

## 2019-08-10 DIAGNOSIS — Z0000 Encounter for general adult medical examination without abnormal findings: Secondary | ICD-10-CM | POA: Diagnosis not present

## 2019-08-10 DIAGNOSIS — Z1331 Encounter for screening for depression: Secondary | ICD-10-CM | POA: Diagnosis not present

## 2019-08-10 DIAGNOSIS — E782 Mixed hyperlipidemia: Secondary | ICD-10-CM | POA: Diagnosis not present

## 2019-08-10 DIAGNOSIS — Z1339 Encounter for screening examination for other mental health and behavioral disorders: Secondary | ICD-10-CM | POA: Diagnosis not present

## 2019-08-10 DIAGNOSIS — R490 Dysphonia: Secondary | ICD-10-CM | POA: Diagnosis not present

## 2019-08-10 DIAGNOSIS — Z86010 Personal history of colonic polyps: Secondary | ICD-10-CM | POA: Diagnosis not present

## 2019-08-19 IMAGING — MR MR KNEE*R* W/O CM
4 of 7 series · 19 of 40 positions shown · non-contrast
Comparison: None.

CLINICAL DATA: Pain around the patella for the past 2 weeks. No
injury. No prior surgery.

EXAM:
MRI OF THE RIGHT KNEE WITHOUT CONTRAST
TECHNIQUE: Multiplanar, multisequence MR imaging of the knee was performed. No
intravenous contrast was administered.

[Series 4: T2 fat-sat · axial · 4.0mm · 0.31mm/px · z∈[-36,+52]mm · 3 of 25 slices shown]
[im 5/25]
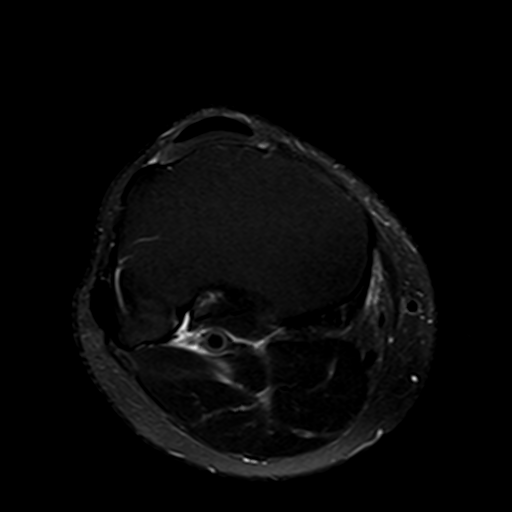
[im 15/25]
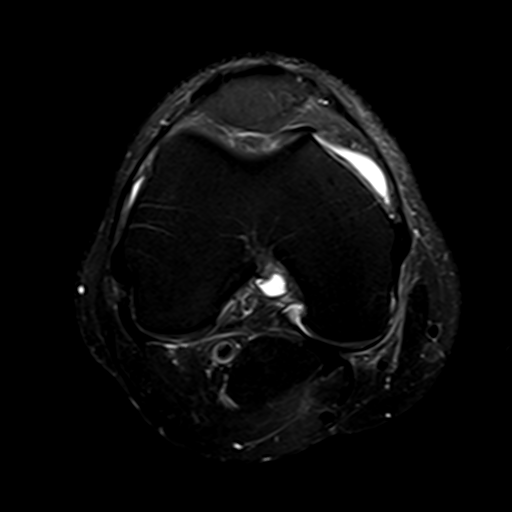
[im 25/25]
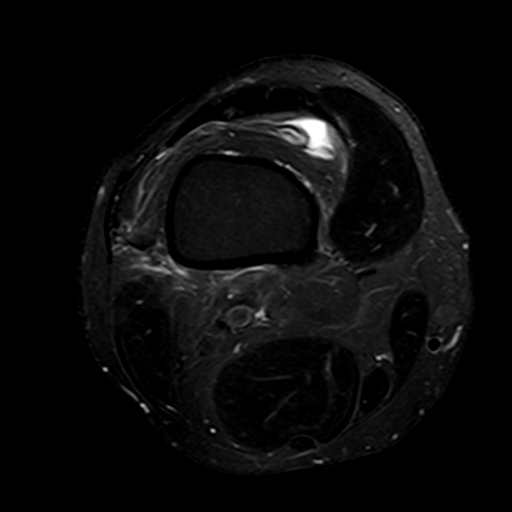

[Series 7: PD fat-sat · coronal · 3.0mm · 0.29mm/px · 7 of 32 slices shown (1 of 3)]
[im 1/32]
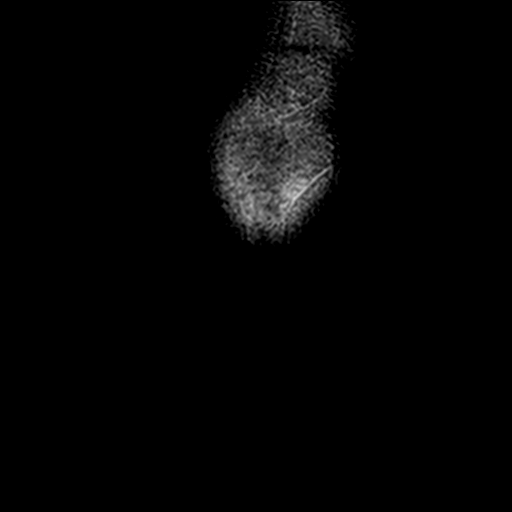
[im 6/32]
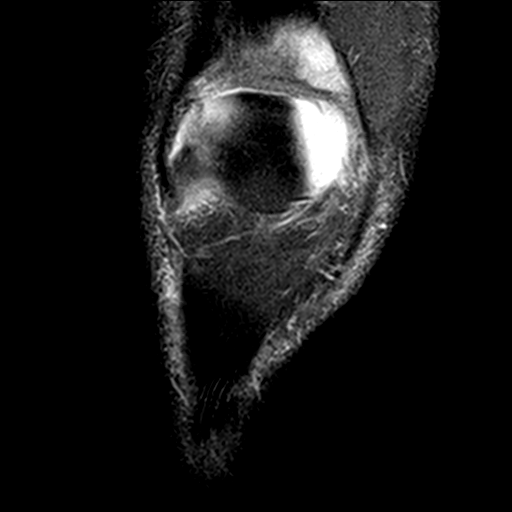
[im 11/32]
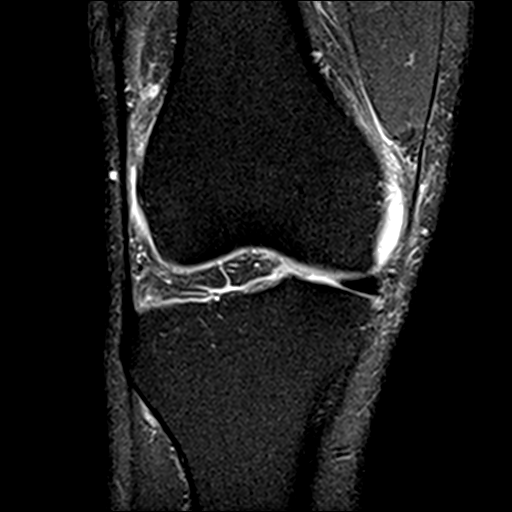
[im 16/32]
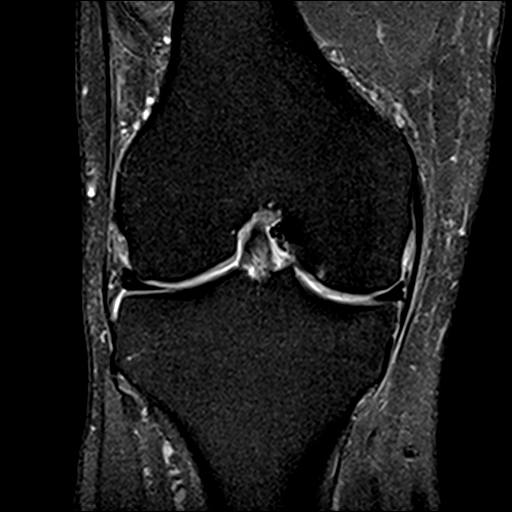
[im 21/32]
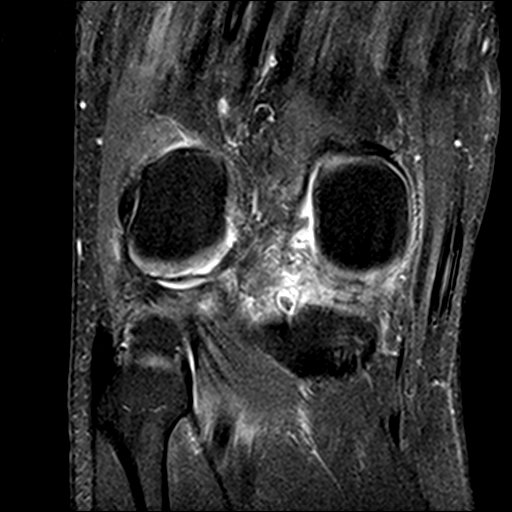
[im 26/32]
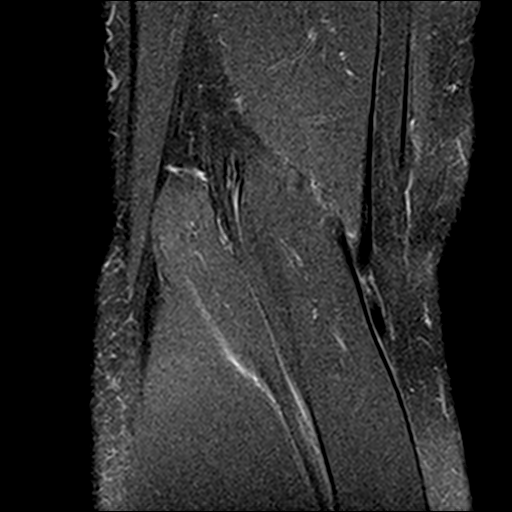
[im 32/32]
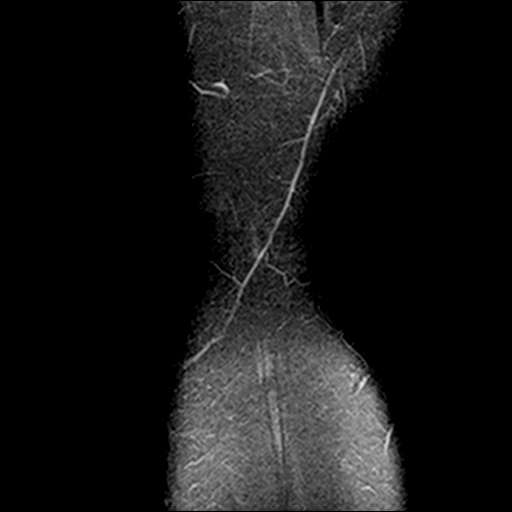

[Series 8: PD fat-sat · sagittal · 3.0mm · 0.29mm/px · 6 of 30 slices shown (2 of 3)]
[im 1/30]
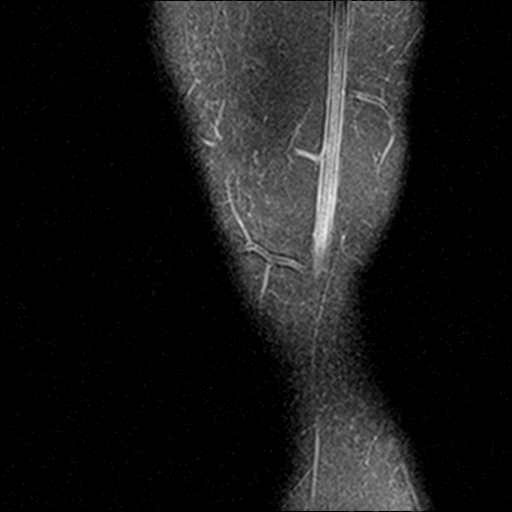
[im 6/30]
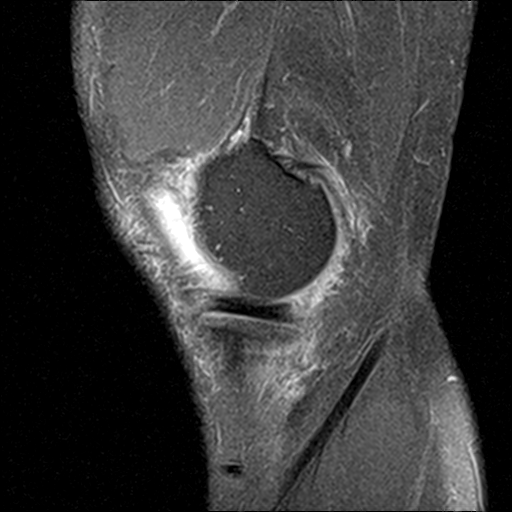
[im 12/30]
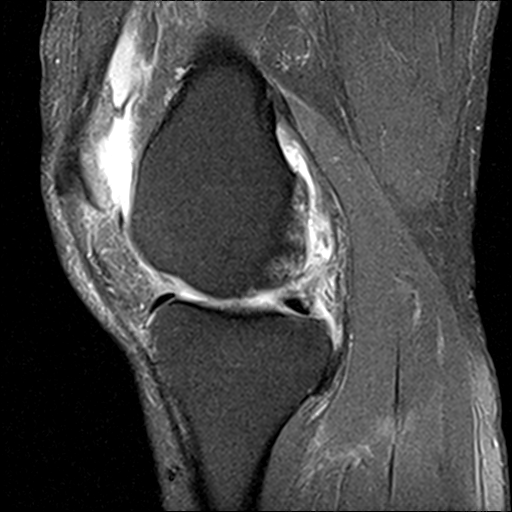
[im 18/30]
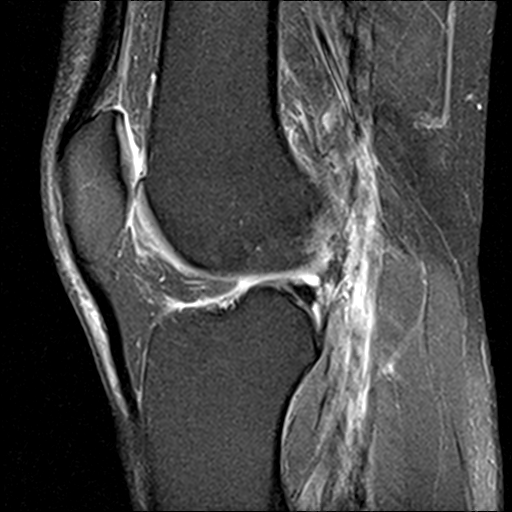
[im 24/30]
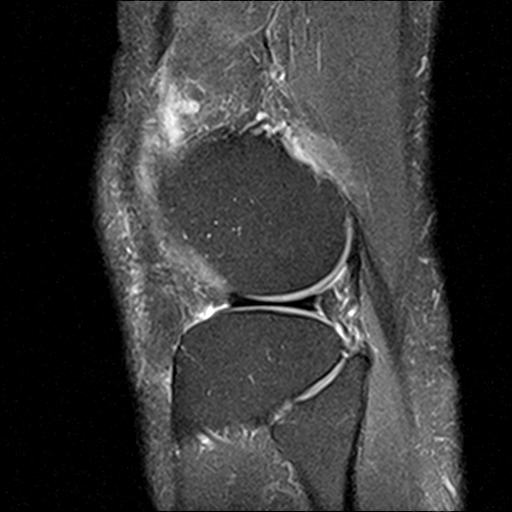
[im 30/30]
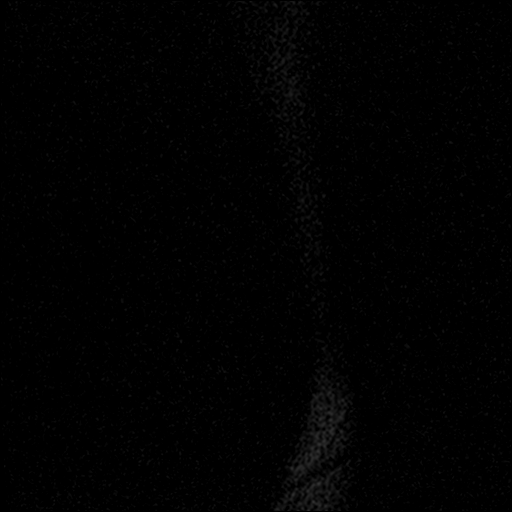

[Series 10: PD fat-sat · oblique · 2.0mm · 0.29mm/px · 3 of 14 slices shown (3 of 3)]
[im 1/14]
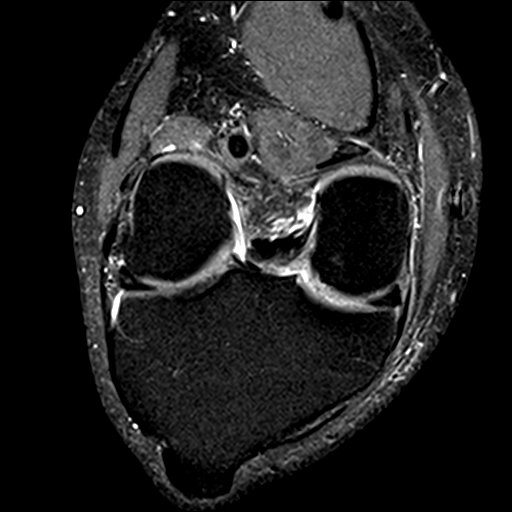
[im 7/14]
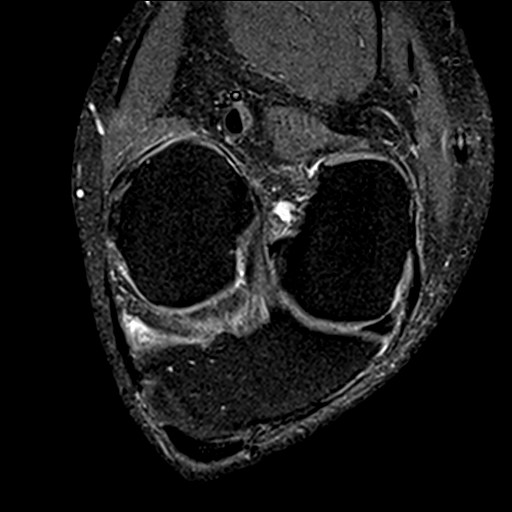
[im 14/14]
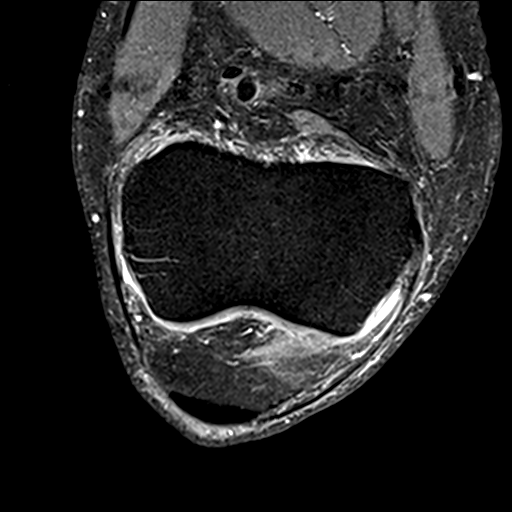

[19 of 40 positions shown; findings below may reference images not displayed]

FINDINGS: MENISCI

Medial meniscus: Predominantly radial tear of the posterior horn
with small horizontal component.

Lateral meniscus:  Intact.

LIGAMENTS

Cruciates:  Intact ACL and PCL.

Collaterals: Medial collateral ligament is intact. Lateral
collateral ligament complex is intact.

CARTILAGE

Patellofemoral: Scattered mild partial-thickness cartilage loss over
the patella and medial trochlea.

Medial: Mild diffuse thinning. Small focal full-thickness cartilage
defect over the posterior weight-bearing medial femoral condyle with
trace underlying subchondral marrow edema.

Lateral:  No chondral defect.

Joint: Small joint effusion. Normal Hoffa's fat. Mildly thickened
medial plica.

Popliteal Fossa:  No Baker cyst. Intact popliteus tendon.

Extensor Mechanism: Intact quadriceps tendon and patellar tendon.
Intact medial and lateral patellar retinaculum. Intact MPFL.

Bones:  No acute fracture or dislocation.  No focal bone lesion.

Other: None.
IMPRESSION: 1. Predominantly radial tear of the medial meniscus posterior horn
with small horizontal component.
2. Minimal degenerative changes of the medial and patellofemoral
compartments with cartilage abnormalities as described above.
3. Small joint effusion.  Mildly thickened medial plica.

## 2019-12-21 DIAGNOSIS — D485 Neoplasm of uncertain behavior of skin: Secondary | ICD-10-CM | POA: Diagnosis not present

## 2019-12-21 DIAGNOSIS — D045 Carcinoma in situ of skin of trunk: Secondary | ICD-10-CM | POA: Diagnosis not present

## 2019-12-21 DIAGNOSIS — D1801 Hemangioma of skin and subcutaneous tissue: Secondary | ICD-10-CM | POA: Diagnosis not present

## 2019-12-21 DIAGNOSIS — D225 Melanocytic nevi of trunk: Secondary | ICD-10-CM | POA: Diagnosis not present

## 2019-12-21 DIAGNOSIS — L821 Other seborrheic keratosis: Secondary | ICD-10-CM | POA: Diagnosis not present

## 2019-12-21 DIAGNOSIS — L812 Freckles: Secondary | ICD-10-CM | POA: Diagnosis not present

## 2020-06-25 DIAGNOSIS — L821 Other seborrheic keratosis: Secondary | ICD-10-CM | POA: Diagnosis not present

## 2020-06-25 DIAGNOSIS — Z85828 Personal history of other malignant neoplasm of skin: Secondary | ICD-10-CM | POA: Diagnosis not present

## 2020-08-09 DIAGNOSIS — R7301 Impaired fasting glucose: Secondary | ICD-10-CM | POA: Diagnosis not present

## 2020-08-09 DIAGNOSIS — E782 Mixed hyperlipidemia: Secondary | ICD-10-CM | POA: Diagnosis not present

## 2020-08-09 DIAGNOSIS — Z125 Encounter for screening for malignant neoplasm of prostate: Secondary | ICD-10-CM | POA: Diagnosis not present

## 2020-08-09 DIAGNOSIS — M109 Gout, unspecified: Secondary | ICD-10-CM | POA: Diagnosis not present

## 2020-08-16 DIAGNOSIS — Z1331 Encounter for screening for depression: Secondary | ICD-10-CM | POA: Diagnosis not present

## 2020-08-16 DIAGNOSIS — R0600 Dyspnea, unspecified: Secondary | ICD-10-CM | POA: Diagnosis not present

## 2020-08-16 DIAGNOSIS — M109 Gout, unspecified: Secondary | ICD-10-CM | POA: Diagnosis not present

## 2020-08-16 DIAGNOSIS — Z0000 Encounter for general adult medical examination without abnormal findings: Secondary | ICD-10-CM | POA: Diagnosis not present

## 2020-08-16 DIAGNOSIS — Z86010 Personal history of colonic polyps: Secondary | ICD-10-CM | POA: Diagnosis not present

## 2020-08-16 DIAGNOSIS — R7301 Impaired fasting glucose: Secondary | ICD-10-CM | POA: Diagnosis not present

## 2020-08-16 DIAGNOSIS — H9313 Tinnitus, bilateral: Secondary | ICD-10-CM | POA: Diagnosis not present

## 2020-08-16 DIAGNOSIS — R82998 Other abnormal findings in urine: Secondary | ICD-10-CM | POA: Diagnosis not present

## 2020-08-16 DIAGNOSIS — Z1339 Encounter for screening examination for other mental health and behavioral disorders: Secondary | ICD-10-CM | POA: Diagnosis not present

## 2020-08-16 DIAGNOSIS — E782 Mixed hyperlipidemia: Secondary | ICD-10-CM | POA: Diagnosis not present

## 2020-08-16 DIAGNOSIS — R490 Dysphonia: Secondary | ICD-10-CM | POA: Diagnosis not present

## 2020-09-05 DIAGNOSIS — Z20822 Contact with and (suspected) exposure to covid-19: Secondary | ICD-10-CM | POA: Diagnosis not present

## 2020-09-20 DIAGNOSIS — Z1212 Encounter for screening for malignant neoplasm of rectum: Secondary | ICD-10-CM | POA: Diagnosis not present

## 2020-12-25 DIAGNOSIS — L570 Actinic keratosis: Secondary | ICD-10-CM | POA: Diagnosis not present

## 2020-12-25 DIAGNOSIS — D1801 Hemangioma of skin and subcutaneous tissue: Secondary | ICD-10-CM | POA: Diagnosis not present

## 2020-12-25 DIAGNOSIS — L853 Xerosis cutis: Secondary | ICD-10-CM | POA: Diagnosis not present

## 2020-12-25 DIAGNOSIS — L821 Other seborrheic keratosis: Secondary | ICD-10-CM | POA: Diagnosis not present

## 2021-03-28 ENCOUNTER — Other Ambulatory Visit: Payer: Self-pay

## 2021-03-28 ENCOUNTER — Encounter (INDEPENDENT_AMBULATORY_CARE_PROVIDER_SITE_OTHER): Payer: Medicare Other | Admitting: Ophthalmology

## 2021-03-28 DIAGNOSIS — H43813 Vitreous degeneration, bilateral: Secondary | ICD-10-CM | POA: Diagnosis not present

## 2021-03-28 DIAGNOSIS — H33301 Unspecified retinal break, right eye: Secondary | ICD-10-CM

## 2021-05-14 DIAGNOSIS — Z23 Encounter for immunization: Secondary | ICD-10-CM | POA: Diagnosis not present

## 2021-09-09 DIAGNOSIS — Z125 Encounter for screening for malignant neoplasm of prostate: Secondary | ICD-10-CM | POA: Diagnosis not present

## 2021-09-09 DIAGNOSIS — M109 Gout, unspecified: Secondary | ICD-10-CM | POA: Diagnosis not present

## 2021-09-09 DIAGNOSIS — E782 Mixed hyperlipidemia: Secondary | ICD-10-CM | POA: Diagnosis not present

## 2021-09-09 DIAGNOSIS — R7301 Impaired fasting glucose: Secondary | ICD-10-CM | POA: Diagnosis not present

## 2021-09-16 DIAGNOSIS — E782 Mixed hyperlipidemia: Secondary | ICD-10-CM | POA: Diagnosis not present

## 2021-09-16 DIAGNOSIS — R490 Dysphonia: Secondary | ICD-10-CM | POA: Diagnosis not present

## 2021-09-16 DIAGNOSIS — R7301 Impaired fasting glucose: Secondary | ICD-10-CM | POA: Diagnosis not present

## 2021-09-16 DIAGNOSIS — K219 Gastro-esophageal reflux disease without esophagitis: Secondary | ICD-10-CM | POA: Diagnosis not present

## 2021-09-16 DIAGNOSIS — Z1331 Encounter for screening for depression: Secondary | ICD-10-CM | POA: Diagnosis not present

## 2021-09-16 DIAGNOSIS — H9313 Tinnitus, bilateral: Secondary | ICD-10-CM | POA: Diagnosis not present

## 2021-09-16 DIAGNOSIS — R82998 Other abnormal findings in urine: Secondary | ICD-10-CM | POA: Diagnosis not present

## 2021-09-16 DIAGNOSIS — R0600 Dyspnea, unspecified: Secondary | ICD-10-CM | POA: Diagnosis not present

## 2021-09-16 DIAGNOSIS — M109 Gout, unspecified: Secondary | ICD-10-CM | POA: Diagnosis not present

## 2021-09-16 DIAGNOSIS — Z1339 Encounter for screening examination for other mental health and behavioral disorders: Secondary | ICD-10-CM | POA: Diagnosis not present

## 2021-09-16 DIAGNOSIS — Z86010 Personal history of colonic polyps: Secondary | ICD-10-CM | POA: Diagnosis not present

## 2021-09-16 DIAGNOSIS — Z0000 Encounter for general adult medical examination without abnormal findings: Secondary | ICD-10-CM | POA: Diagnosis not present

## 2021-09-22 ENCOUNTER — Other Ambulatory Visit: Payer: Self-pay

## 2021-09-22 ENCOUNTER — Ambulatory Visit (INDEPENDENT_AMBULATORY_CARE_PROVIDER_SITE_OTHER): Payer: Medicare Other | Admitting: Cardiovascular Disease

## 2021-09-22 VITALS — BP 108/74 | HR 63 | Ht 71.0 in | Wt 200.6 lb

## 2021-09-22 DIAGNOSIS — R079 Chest pain, unspecified: Secondary | ICD-10-CM

## 2021-09-22 DIAGNOSIS — R0602 Shortness of breath: Secondary | ICD-10-CM

## 2021-09-22 DIAGNOSIS — R072 Precordial pain: Secondary | ICD-10-CM

## 2021-09-22 MED ORDER — METOPROLOL TARTRATE 50 MG PO TABS: 50.0000 mg | ORAL_TABLET | Freq: Once | ORAL | 0 refills | Status: DC

## 2021-09-22 NOTE — Progress Notes (Signed)
Chief Complaint  Patient presents with   New Patient (Initial Visit)    Dyspnea    History of Present Illness: 70 yo male with history of hyperlipidemia, GERD and arthritis who is here today as a  new consult, referred by Dr. Brigitte Pulse, for the evaluation of dyspnea on exertion. Over the past year he has noticed dyspnea when climbing up hills. He has no exertional chest pain. He has some reflux type symptoms. No lower extremity edema. No dizziness, near syncope or palpitations. No known heart disease. His brother had an MI in his 61s.   Primary Care Physician: Ginger Organ., MD   Past Medical History:  Diagnosis Date   Arthritis    Colon polyps    GERD (gastroesophageal reflux disease)    Gout    Hyperlipidemia    Shingles     Past Surgical History:  Procedure Laterality Date   COSMETIC SURGERY     left cheek   KNEE ARTHROSCOPY     left   KNEE SURGERY Right    SHOULDER ARTHROSCOPY     left   sinus surgeries     TONSILLECTOMY      Current Outpatient Medications  Medication Sig Dispense Refill   allopurinol (ZYLOPRIM) 300 MG tablet Take 300 mg by mouth daily.     atorvastatin (LIPITOR) 20 MG tablet Take 20 mg by mouth daily.     Coenzyme Q10 (CO Q 10) 100 MG CAPS Take 1 capsule by mouth daily.     ibuprofen (ADVIL,MOTRIN) 200 MG tablet Take 200 mg by mouth every 6 (six) hours as needed for pain.     metoprolol tartrate (LOPRESSOR) 50 MG tablet Take 1 tablet (50 mg total) by mouth once for 1 dose. Take 90-120 minutes prior to scan. 1 tablet 0   omeprazole (PRILOSEC) 40 MG capsule Take 40 mg by mouth daily.     No current facility-administered medications for this visit.    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   Smoking status: Never   Smokeless tobacco: Current    Types: Nurse, children's Use: Never used  Substance and Sexual Activity   Alcohol use: Yes    Alcohol/week: 7.0 standard drinks    Types: 7 Glasses of wine per week    Comment: 7 glasses of wine per day   Drug use: Never   Sexual activity: Not on file  Other Topics Concern   Not on file  Social History Narrative   Not on file   Social Determinants of Health   Financial Resource Strain: Not on file  Food Insecurity: Not on file  Transportation Needs: Not on file  Physical Activity: Not on file  Stress: Not on file  Social Connections: Not on file  Intimate Partner Violence: Not on file    Family History  Problem Relation Age of Onset   Hypertension Mother    Diabetes Mother    Dementia Mother    Heart attack Father 18   Colon cancer Neg Hx    Pancreatic cancer Neg Hx    Stomach cancer Neg Hx    Esophageal cancer Neg  Hx     Review of Systems:  As stated in the HPI and otherwise negative.   BP 108/74 (BP Location: Left Arm, Patient Position: Sitting, Cuff Size: Normal)   Pulse 63   Ht 5\' 11"  (1.803 m)   Wt 200 lb 9.6 oz (91 kg)   SpO2 97%   BMI 27.98 kg/m   Physical Examination: General: Well developed, well nourished, NAD  HEENT: OP clear, mucus membranes moist  SKIN: warm, dry. No rashes. Neuro: No focal deficits  Musculoskeletal: Muscle strength 5/5 all ext  Psychiatric: Mood and affect normal  Neck: No JVD, no carotid bruits, no thyromegaly, no lymphadenopathy.  Lungs:Clear bilaterally, no wheezes, rhonci, crackles Cardiovascular: Regular rate and rhythm. No murmurs, gallops or rubs. Abdomen:Soft. Bowel sounds present. Non-tender.  Extremities: No lower extremity edema. Pulses are 2 + in the bilateral DP/PT.  EKG:  EKG is ordered today. The ekg ordered today demonstrates sinus  Recent Labs: No results found for requested labs within last 8760 hours.    Lipid Panel No results found for: CHOL, TRIG, HDL, CHOLHDL, VLDL, LDLCALC, LDLDIRECT   Wt Readings from Last 3 Encounters:  09/22/21 200 lb 9.6 oz (91 kg)  08/18/18 191 lb (86.6 kg)  08/04/18 191 lb 9.6 oz (86.9 kg)     Assessment and Plan:   1. Dyspnea on exertion/chest pain: Chest pain at rest, mostly feels like GERD but dyspnea when climbing hills. Resolves with rest. Risk factors for CAD include age, FH of CAD (brother with MI in his 81s), HLD. Will arrange cardiac CTA to exclude obstructive CAD. Will arrange echo to assess LVEF and exclude structural heart disease.   Current medicines are reviewed at length with the patient today.  The patient does not have concerns regarding medicines.  The following changes have been made:  no change  Labs/ tests ordered today include:   Orders Placed This Encounter  Procedures   CT CORONARY MORPH W/CTA COR W/SCORE W/CA W/CM &/OR WO/CM   EKG 12-Lead   ECHOCARDIOGRAM COMPLETE    Disposition:   F/U with me in one year.   Signed, Lauree Chandler, MD 09/22/2021 3:46 PM    [facility] [place], [place],   [address] Phone: [phone]; Fax: [phone]

## 2021-09-22 NOTE — Patient Instructions (Addendum)
Medication Instructions:  No changes *If you need a refill on your cardiac medications before your next appointment, please call your pharmacy*   Lab Work: From Dr. Raul Del offices - 09/09/22  If you have labs (blood work) drawn today and your tests are completely normal, you will receive your results only by: [place] (if you have MyChart) OR A paper copy in the mail If you have any lab test that is abnormal or we need to change your treatment, we will call you to review the results.   Testing/Procedures: Your physician has requested that you have an echocardiogram. Echocardiography is a painless test that uses sound waves to create images of your heart. It provides your doctor with information about the size and shape of your heart and how well your heart's chambers and valves are working. This procedure takes approximately one hour. There are no restrictions for this procedure.  Cardiac CTA - see instructions below.   Follow-Up: At [facility], you and your health needs are our priority.  As part of our continuing mission to provide you with exceptional heart care, we have created designated Provider Care Teams.  These Care Teams include your primary Cardiologist (physician) and Advanced Practice Providers (APPs -  Physician Assistants and Nurse Practitioners) who all work together to provide you with the care you need, when you need it.   Your next appointment:   12 month(s)  The format for your next appointment:   In Person  Provider:   Lauree Chandler, MD     Other Instructions   Your cardiac CT will be scheduled at:  [facility] [address] [place], [place] [address] [phone]  Please arrive at the [facility] main entrance (entrance A) of [facility] 30 minutes prior to test start time. You can use the FREE valet parking offered at the main entrance (encouraged to control the heart rate for the test) Proceed to the [facility] Radiology Department (first floor) to check-in and test prep.   Please follow these instructions carefully (unless otherwise directed):  Hold all erectile dysfunction medications at least 3 days (72 hrs) prior to test.  On the Night Before the Test: Be sure to Drink plenty of water. Do not consume any caffeinated/decaffeinated beverages or chocolate 12 hours prior to your test. Do not take any antihistamines 12 hours prior to your test.  On the Day of the Test: Drink plenty of water until 1 hour prior to the test. Do not eat any food 4 hours prior to the test. You may take your regular medications prior to the test.  Take metoprolol (Lopressor) two hours prior to test.      After the Test: Drink plenty of water. After receiving IV contrast, you may experience a mild flushed feeling. This is normal. On occasion, you may experience a mild rash up to 24 hours after the test. This is not dangerous. If this occurs, you can take Benadryl 25 mg and increase your fluid intake. If you experience trouble breathing, this can be serious. If it is severe call 911 IMMEDIATELY. If it is mild, please call our office. If you take any of these medications: Glipizide/Metformin, Avandament, Glucavance, please do not take 48 hours after completing test unless otherwise instructed.  Please allow 2-4 weeks for scheduling of routine cardiac CTs. Some insurance companies require a pre-authorization which may delay scheduling of this test.   For non-scheduling related questions, please contact the cardiac imaging nurse  navigator should you have any questions/concerns: Marchia Bond, Cardiac Imaging Nurse Navigator Gordy Clement, Cardiac Imaging Nurse Navigator [facility] Direct Office Dial: [phone]   For scheduling needs, including cancellations and rescheduling, please call [place], [phone].

## 2021-10-06 ENCOUNTER — Telehealth (HOSPITAL_COMMUNITY): Payer: Self-pay | Admitting: *Deleted

## 2021-10-06 NOTE — Telephone Encounter (Signed)
Reaching out to patient to offer assistance regarding upcoming cardiac imaging study; pt verbalizes understanding of appt date/time, parking situation and where to check in, pre-test NPO status and medications ordered, and verified current allergies; name and call back number provided for further questions should they arise  Dylan Clement RN Navigator Cardiac Imaging [facility] [phone] office [phone] cell  Patient to take 50mg  metoprolol tartrate two hours prior to cardiac CT scan. He is aware to arrive at 9am for his 9:30am scan.

## 2021-10-08 ENCOUNTER — Ambulatory Visit (HOSPITAL_COMMUNITY)
Admission: RE | Admit: 2021-10-08 | Discharge: 2021-10-08 | Disposition: A | Discharge status: Home / Self Care | Payer: Medicare Other | Source: Ambulatory Visit | Attending: Cardiovascular Disease | Admitting: Cardiovascular Disease

## 2021-10-08 ENCOUNTER — Other Ambulatory Visit: Payer: Self-pay

## 2021-10-08 DIAGNOSIS — R072 Precordial pain: Secondary | ICD-10-CM

## 2021-10-08 DIAGNOSIS — R0602 Shortness of breath: Secondary | ICD-10-CM | POA: Diagnosis not present

## 2021-10-08 DIAGNOSIS — R079 Chest pain, unspecified: Secondary | ICD-10-CM | POA: Diagnosis not present

## 2021-10-08 MED ORDER — IOHEXOL 350 MG/ML SOLN
100.0000 mL | Freq: Once | INTRAVENOUS | Status: AC | PRN
  Administered 2021-10-08: 10:00:00 100 mL via INTRAVENOUS

## 2021-10-08 MED ORDER — NITROGLYCERIN 0.4 MG SL SUBL
0.8000 mg | SUBLINGUAL_TABLET | Freq: Once | SUBLINGUAL | Status: AC
  Administered 2021-10-08: 09:00:00 0.8 mg via SUBLINGUAL

## 2021-10-08 MED ORDER — NITROGLYCERIN 0.4 MG SL SUBL
SUBLINGUAL_TABLET | SUBLINGUAL | Status: AC
  Filled 2021-10-08: qty 2

## 2021-10-09 ENCOUNTER — Other Ambulatory Visit: Payer: Self-pay

## 2021-10-09 ENCOUNTER — Ambulatory Visit (HOSPITAL_COMMUNITY): Payer: Medicare Other | Attending: Internal Medicine

## 2021-10-09 DIAGNOSIS — R0602 Shortness of breath: Secondary | ICD-10-CM | POA: Diagnosis not present

## 2021-10-09 LAB — ECHOCARDIOGRAM COMPLETE
Area-P 1/2: 3.27 cm2
S' Lateral: 3.9 cm

## 2021-10-10 ENCOUNTER — Other Ambulatory Visit: Payer: Self-pay

## 2021-10-10 MED ORDER — ASPIRIN EC 81 MG PO TBEC: 81.0000 mg | DELAYED_RELEASE_TABLET | Freq: Every day | ORAL | 3 refills | Status: AC

## 2021-10-16 ENCOUNTER — Other Ambulatory Visit: Payer: Medicare Other

## 2021-11-24 DIAGNOSIS — Z87891 Personal history of nicotine dependence: Secondary | ICD-10-CM | POA: Diagnosis not present

## 2021-11-24 DIAGNOSIS — J324 Chronic pansinusitis: Secondary | ICD-10-CM | POA: Diagnosis not present

## 2021-12-22 DIAGNOSIS — D1801 Hemangioma of skin and subcutaneous tissue: Secondary | ICD-10-CM | POA: Diagnosis not present

## 2021-12-22 DIAGNOSIS — D225 Melanocytic nevi of trunk: Secondary | ICD-10-CM | POA: Diagnosis not present

## 2021-12-22 DIAGNOSIS — L853 Xerosis cutis: Secondary | ICD-10-CM | POA: Diagnosis not present

## 2021-12-22 DIAGNOSIS — L821 Other seborrheic keratosis: Secondary | ICD-10-CM | POA: Diagnosis not present

## 2021-12-22 DIAGNOSIS — L812 Freckles: Secondary | ICD-10-CM | POA: Diagnosis not present

## 2022-05-18 DIAGNOSIS — L80 Vitiligo: Secondary | ICD-10-CM | POA: Diagnosis not present

## 2022-09-22 NOTE — Progress Notes (Unsigned)
No chief complaint on file.   History of Present Illness: 71 yo male with history of mild CAD by coronary CTA, , mild mitral regurgitation, hyperlipidemia, GERD and arthritis who is here today for follow up. I saw him as a  new consult in December 2022 for the evaluation of dyspnea on exertion. He described dyspnea when climbing up hills. His brother had an MI in his 61s. Echo 10/09/22 with LVEF=55-60%. Mild MR. Coronary CTA December 2022 with mild non-obstructive calcified CAD.   He is here today for follow up. The patient denies any chest pain, dyspnea, palpitations, lower extremity edema, orthopnea, PND, dizziness, near syncope or syncope.    Primary Care Physician: Ginger Organ., MD   Past Medical History:  Diagnosis Date   Arthritis    Colon polyps    GERD (gastroesophageal reflux disease)    Gout    Hyperlipidemia    Shingles     Past Surgical History:  Procedure Laterality Date   COSMETIC SURGERY     left cheek   KNEE ARTHROSCOPY     left   KNEE SURGERY Right    SHOULDER ARTHROSCOPY     left   sinus surgeries     TONSILLECTOMY      Current Outpatient Medications  Medication Sig Dispense Refill   allopurinol (ZYLOPRIM) 300 MG tablet Take 300 mg by mouth daily.     aspirin EC 81 MG tablet Take 1 tablet (81 mg total) by mouth daily. Swallow whole. 90 tablet 3   atorvastatin (LIPITOR) 20 MG tablet Take 20 mg by mouth daily.     Coenzyme Q10 (CO Q 10) 100 MG CAPS Take 1 capsule by mouth daily.     ibuprofen (ADVIL,MOTRIN) 200 MG tablet Take 200 mg by mouth every 6 (six) hours as needed for pain.     metoprolol tartrate (LOPRESSOR) 50 MG tablet Take 1 tablet (50 mg total) by mouth once for 1 dose. Take 90-120 minutes prior to scan. 1 tablet 0   omeprazole (PRILOSEC) 40 MG capsule Take 40 mg by mouth daily.     No current facility-administered medications for this visit.    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   Smoking status: Never   Smokeless tobacco: Current    Types: Nurse, children's Use: Never used  Substance and Sexual Activity   Alcohol use: Yes    Alcohol/week: 7.0 standard drinks of alcohol    Types: 7 Glasses of wine per week    Comment: 7 glasses of wine per day   Drug use: Never   Sexual activity: Not on file  Other Topics Concern   Not on file  Social History Narrative   Not on file   Social Determinants of Health   Financial Resource Strain: Not on file  Food Insecurity: Not on file  Transportation Needs: Not on file  Physical Activity: Not on file  Stress: Not on file  Social Connections: Not on file  Intimate Partner Violence: Not on file    Family History  Problem Relation Age of Onset   Hypertension Mother    Diabetes Mother  Dementia Mother    Heart attack Father 34   Colon cancer Neg Hx    Pancreatic cancer Neg Hx    Stomach cancer Neg Hx    Esophageal cancer Neg Hx     Review of Systems:  As stated in the HPI and otherwise negative.   There were no vitals taken for this visit.  Physical Examination: General: Well developed, well nourished, NAD  HEENT: OP clear, mucus membranes moist  SKIN: warm, dry. No rashes. Neuro: No focal deficits  Musculoskeletal: Muscle strength 5/5 all ext  Psychiatric: Mood and affect normal  Neck: No JVD, no carotid bruits, no thyromegaly, no lymphadenopathy.  Lungs:Clear bilaterally, no wheezes, rhonci, crackles Cardiovascular: Regular rate and rhythm. No murmurs, gallops or rubs. Abdomen:Soft. Bowel sounds present. Non-tender.  Extremities: No lower extremity edema. Pulses are 2 + in the bilateral DP/PT.  EKG:  EKG is ***  ordered today. The ekg ordered  today demonstrates   Echo 10/09/21:   1. Left ventricular ejection fraction, by estimation, is 55 to 60%. Left  ventricular ejection fraction by 3D volume is 55 %. The left ventricle has  normal function. The left ventricle has no regional wall motion  abnormalities. Left ventricular diastolic   parameters were normal. The average left ventricular global longitudinal  strain is -21.7 %. The global longitudinal strain is normal.   2. Right ventricular systolic function is normal. The right ventricular  size is normal. There is normal pulmonary artery systolic pressure. The  estimated right ventricular systolic pressure is 61.4 mmHg.   3. The mitral valve is normal in structure. Mild mitral valve  regurgitation. No evidence of mitral stenosis.   4. The aortic valve is tricuspid. Aortic valve regurgitation is not  visualized. No aortic stenosis is present.   5. The inferior vena cava is normal in size with greater than 50%  respiratory variability, suggesting right atrial pressure of 3 mmHg.    Recent Labs: No results found for requested labs within last 365 days.   Lipid Panel No results found for: "CHOL", "TRIG", "HDL", "CHOLHDL", "VLDL", "LDLCALC", "LDLDIRECT"   Wt Readings from Last 3 Encounters:  09/22/21 200 lb 9.6 oz (91 kg)  08/18/18 191 lb (86.6 kg)  08/04/18 191 lb 9.6 oz (86.9 kg)     Assessment and Plan:   1. CAD without angina: Mild CAD by coronary CTA December 2022. No chest pain. Continue ASA and statin.    2. Mitral regurgitation: Mild by echo in December 2022  Labs/ tests ordered today include:   No orders of the defined types were placed in this encounter.  Disposition:   F/U with me in one year.   Signed, Lauree Chandler, MD 09/22/2022 5:25 PM    [facility] [place], [place], [place]  [address] Phone: [phone]; Fax: [phone]

## 2022-09-23 ENCOUNTER — Encounter: Payer: Self-pay | Admitting: Cardiovascular Disease

## 2022-09-23 ENCOUNTER — Ambulatory Visit: Payer: Medicare Other | Attending: Cardiovascular Disease | Admitting: Cardiovascular Disease

## 2022-09-23 VITALS — BP 90/60 | HR 75 | Ht 71.0 in | Wt 204.8 lb

## 2022-09-23 DIAGNOSIS — I340 Nonrheumatic mitral (valve) insufficiency: Secondary | ICD-10-CM | POA: Diagnosis not present

## 2022-09-23 DIAGNOSIS — I2510 Atherosclerotic heart disease of native coronary artery without angina pectoris: Secondary | ICD-10-CM | POA: Insufficient documentation

## 2022-09-23 NOTE — Patient Instructions (Signed)
Medication Instructions:  No changes *If you need a refill on your cardiac medications before your next appointment, please call your pharmacy*   Lab Work: none If you have labs (blood work) drawn today and your tests are completely normal, you will receive your results only by: MyChart Message (if you have MyChart) OR A paper copy in the mail If you have any lab test that is abnormal or we need to change your treatment, we will call you to review the results.   Testing/Procedures: none   Follow-Up: At [facility], you and your health needs are our priority.  As part of our continuing mission to provide you with exceptional heart care, we have created designated Provider Care Teams.  These Care Teams include your primary Cardiologist (physician) and Advanced Practice Providers (APPs -  Physician Assistants and Nurse Practitioners) who all work together to provide you with the care you need, when you need it.  We recommend signing up for the patient portal called "MyChart".  Sign up information is provided on this After Visit Summary.  MyChart is used to connect with patients for Virtual Visits (Telemedicine).  Patients are able to view lab/test results, encounter notes, upcoming appointments, etc.  Non-urgent messages can be sent to your provider as well.   To learn more about what you can do with MyChart, go to [email or web address].    Your next appointment:   12 month(s)  The format for your next appointment:   In Person  Provider:   Christopher McAlhany, MD     Other Instructions   Important Information About Sugar       

## 2022-09-28 DIAGNOSIS — E782 Mixed hyperlipidemia: Secondary | ICD-10-CM | POA: Diagnosis not present

## 2022-09-28 DIAGNOSIS — Z125 Encounter for screening for malignant neoplasm of prostate: Secondary | ICD-10-CM | POA: Diagnosis not present

## 2022-09-28 DIAGNOSIS — R7989 Other specified abnormal findings of blood chemistry: Secondary | ICD-10-CM | POA: Diagnosis not present

## 2022-09-28 DIAGNOSIS — M109 Gout, unspecified: Secondary | ICD-10-CM | POA: Diagnosis not present

## 2022-09-28 DIAGNOSIS — R7301 Impaired fasting glucose: Secondary | ICD-10-CM | POA: Diagnosis not present

## 2022-10-05 DIAGNOSIS — I2510 Atherosclerotic heart disease of native coronary artery without angina pectoris: Secondary | ICD-10-CM | POA: Diagnosis not present

## 2022-10-05 DIAGNOSIS — Z7184 Encounter for health counseling related to travel: Secondary | ICD-10-CM | POA: Diagnosis not present

## 2022-10-05 DIAGNOSIS — L80 Vitiligo: Secondary | ICD-10-CM | POA: Diagnosis not present

## 2022-10-05 DIAGNOSIS — Z1339 Encounter for screening examination for other mental health and behavioral disorders: Secondary | ICD-10-CM | POA: Diagnosis not present

## 2022-10-05 DIAGNOSIS — E782 Mixed hyperlipidemia: Secondary | ICD-10-CM | POA: Diagnosis not present

## 2022-10-05 DIAGNOSIS — Z23 Encounter for immunization: Secondary | ICD-10-CM | POA: Diagnosis not present

## 2022-10-05 DIAGNOSIS — Z86010 Personal history of colonic polyps: Secondary | ICD-10-CM | POA: Diagnosis not present

## 2022-10-05 DIAGNOSIS — Z1331 Encounter for screening for depression: Secondary | ICD-10-CM | POA: Diagnosis not present

## 2022-10-05 DIAGNOSIS — Z0000 Encounter for general adult medical examination without abnormal findings: Secondary | ICD-10-CM | POA: Diagnosis not present

## 2022-10-05 DIAGNOSIS — R7301 Impaired fasting glucose: Secondary | ICD-10-CM | POA: Diagnosis not present

## 2022-10-05 DIAGNOSIS — R82998 Other abnormal findings in urine: Secondary | ICD-10-CM | POA: Diagnosis not present

## 2023-01-07 DIAGNOSIS — I2510 Atherosclerotic heart disease of native coronary artery without angina pectoris: Secondary | ICD-10-CM | POA: Diagnosis not present

## 2023-01-07 DIAGNOSIS — L812 Freckles: Secondary | ICD-10-CM | POA: Diagnosis not present

## 2023-01-07 DIAGNOSIS — L80 Vitiligo: Secondary | ICD-10-CM | POA: Diagnosis not present

## 2023-01-07 DIAGNOSIS — R7301 Impaired fasting glucose: Secondary | ICD-10-CM | POA: Diagnosis not present

## 2023-01-07 DIAGNOSIS — L853 Xerosis cutis: Secondary | ICD-10-CM | POA: Diagnosis not present

## 2023-01-07 DIAGNOSIS — L821 Other seborrheic keratosis: Secondary | ICD-10-CM | POA: Diagnosis not present

## 2023-01-07 DIAGNOSIS — R55 Syncope and collapse: Secondary | ICD-10-CM | POA: Diagnosis not present

## 2023-01-07 DIAGNOSIS — D1801 Hemangioma of skin and subcutaneous tissue: Secondary | ICD-10-CM | POA: Diagnosis not present

## 2023-01-08 ENCOUNTER — Telehealth: Payer: Self-pay | Admitting: Cardiovascular Disease

## 2023-01-08 DIAGNOSIS — R42 Dizziness and giddiness: Secondary | ICD-10-CM

## 2023-01-08 NOTE — Telephone Encounter (Signed)
Received call from Dr. Brigitte Pulse on my cellphone yesterday. Pt has been having episodes of dizziness. Can we arrange a 14 day Zio cardiac monitor? Thanks, chris

## 2023-01-11 ENCOUNTER — Ambulatory Visit: Payer: Medicare Other | Attending: Cardiovascular Disease

## 2023-01-11 DIAGNOSIS — R42 Dizziness and giddiness: Secondary | ICD-10-CM

## 2023-01-11 NOTE — Progress Notes (Unsigned)
Enrolled for [employer] to mail a ZIO XT long term holter monitor to the patients address on file.  

## 2023-01-11 NOTE — Addendum Note (Signed)
Addended by: Rodman Key on: 01/11/2023 02:02 PM   Modules accepted: Orders

## 2023-01-11 NOTE — Telephone Encounter (Signed)
Spoke w patient.  Reviewed recommendation for 14 day Zio.  Pt is in agreement and appreciative for assistance.  Aware monitor will be mailed and to call if any questions/concerns.

## 2023-01-16 DIAGNOSIS — R42 Dizziness and giddiness: Secondary | ICD-10-CM

## 2023-02-09 DIAGNOSIS — R42 Dizziness and giddiness: Secondary | ICD-10-CM | POA: Diagnosis not present

## 2023-02-12 ENCOUNTER — Telehealth: Payer: Self-pay | Admitting: *Deleted

## 2023-02-12 NOTE — Telephone Encounter (Signed)
-----   Message from Kathleene Hazel, MD sent at 02/09/2023 12:02 PM EDT ----- Can we let him know that his monitor showed early beats with normal sinus rhythm. I also forwarded the report to his primary physician Dr. Clelia Croft. Thanks, chris

## 2023-02-12 NOTE — Telephone Encounter (Signed)
The patient has been notified of the result and verbalized understanding.  All questions (if any) were answered.  The patient only had that one episode of dizziness and is feeling great. Lendon Ka, RN 02/12/2023 4:29 PM

## 2023-02-25 ENCOUNTER — Other Ambulatory Visit: Payer: Self-pay

## 2023-02-25 ENCOUNTER — Emergency Department (HOSPITAL_BASED_OUTPATIENT_CLINIC_OR_DEPARTMENT_OTHER)
Admission: EM | Admit: 2023-02-25 | Discharge: 2023-02-25 | Disposition: A | Discharge status: Home / Self Care | Payer: Medicare Other | Attending: Emergency Medicine | Admitting: Emergency Medicine

## 2023-02-25 ENCOUNTER — Encounter (HOSPITAL_BASED_OUTPATIENT_CLINIC_OR_DEPARTMENT_OTHER): Payer: Self-pay | Admitting: Emergency Medicine

## 2023-02-25 DIAGNOSIS — Z7982 Long term (current) use of aspirin: Secondary | ICD-10-CM | POA: Diagnosis not present

## 2023-02-25 DIAGNOSIS — S51811A Laceration without foreign body of right forearm, initial encounter: Secondary | ICD-10-CM | POA: Diagnosis not present

## 2023-02-25 DIAGNOSIS — W268XXA Contact with other sharp object(s), not elsewhere classified, initial encounter: Secondary | ICD-10-CM | POA: Diagnosis not present

## 2023-02-25 DIAGNOSIS — Y9389 Activity, other specified: Secondary | ICD-10-CM | POA: Insufficient documentation

## 2023-02-25 MED ORDER — LIDOCAINE HCL (PF) 1 % IJ SOLN
10.0000 mL | Freq: Once | INTRAMUSCULAR | Status: AC
  Administered 2023-02-25: 10 mL via INTRADERMAL

## 2023-02-25 MED ORDER — LIDOCAINE-EPINEPHRINE (PF) 2 %-1:200000 IJ SOLN
INTRAMUSCULAR | Status: AC
  Filled 2023-02-25: qty 20

## 2023-02-25 NOTE — ED Provider Notes (Signed)
[place] EMERGENCY DEPARTMENT AT [facility] Provider Note   CSN: [id number] Arrival date & time: 02/25/23  0830     History  Chief Complaint  Patient presents with   Laceration    Dylan Cortez is a 72 y.o. male presenting emergency department a laceration to his right wrist.  He reports he accidentally cut his wrist with the propeller of a model airplane or remote control airplane last night.  He did irrigate the wound and his wife put Steri-Strips on him.  He is not on blood thinners.  HPI     Home Medications Prior to Admission medications   Medication Sig Start Date End Date Taking? Authorizing Provider  allopurinol (ZYLOPRIM) 300 MG tablet Take 300 mg by mouth daily.    [provider]  aspirin EC 81 MG tablet Take 1 tablet (81 mg total) by mouth daily. Swallow whole. 10/10/21   Kathleene Hazel, MD  atorvastatin (LIPITOR) 20 MG tablet Take 20 mg by mouth daily.    [provider]  Coenzyme Q10 (CO Q 10) 100 MG CAPS Take 1 capsule by mouth daily. 08/10/19   [provider]  ibuprofen (ADVIL,MOTRIN) 200 MG tablet Take 200 mg by mouth every 6 (six) hours as needed for pain.    [provider]  omeprazole (PRILOSEC) 40 MG capsule Take 40 mg by mouth daily.    [provider]      Allergies    Patient has no known allergies.    Review of Systems   Review of Systems  Physical Exam Updated Vital Signs BP (!) 143/82 (BP Location: Left Arm)   Pulse 75   Temp 97.9 F (36.6 C) (Oral)   Resp 19   Ht 5\' 11"  (1.803 m)   Wt 88.5 kg   SpO2 96%   BMI 27.20 kg/m  Physical Exam Constitutional:      General: He is not in acute distress. HENT:     Head: Normocephalic and atraumatic.  Eyes:     Conjunctiva/sclera: Conjunctivae normal.     Pupils: Pupils are equal, round, and reactive to light.  Cardiovascular:     Rate and Rhythm: Normal rate and regular rhythm.  Pulmonary:     Effort: Pulmonary effort  is normal. No respiratory distress.  Skin:    General: Skin is warm and dry.     Comments: 3 linear lacerations across the right distal forearm, superficial, the most distal laceration does have some gaping component, about 4 cm in length  Neurological:     General: No focal deficit present.     Mental Status: He is alert. Mental status is at baseline.  Psychiatric:        Mood and Affect: Mood normal.        Behavior: Behavior normal.     ED Results / Procedures / Treatments   Labs (all labs ordered are listed, but only abnormal results are displayed) Labs Reviewed - No data to display  EKG None  Radiology No results found.  Procedures .Marland KitchenLaceration Repair  Date/Time: 02/25/2023 9:58 AM  Performed by: Terald Sleeper, MD Authorized by: Terald Sleeper, MD   Consent:    Consent obtained:  Verbal   Consent given by:  Patient   Risks discussed:  Infection, pain, poor cosmetic result and poor wound healing Universal protocol:    Procedure explained and questions answered to patient or proxy's satisfaction: yes     Immediately prior to procedure, a  time out was called: yes     Patient identity confirmed:  Arm band Anesthesia:    Anesthesia method:  Local infiltration   Local anesthetic:  Lidocaine 1% WITH epi Laceration details:    Location:  Shoulder/arm   Shoulder/arm location:  R lower arm   Length (cm):  4   Depth (mm):  3 Pre-procedure details:    Preparation:  Patient was prepped and draped in usual sterile fashion Exploration:    Imaging outcome: foreign body not noted     Wound exploration: wound explored through full range of motion and entire depth of wound visualized     Wound extent: no foreign body, no signs of injury, no nerve damage, no tendon damage, no underlying fracture and no vascular damage     Contaminated: no   Treatment:    Area cleansed with:  Saline   Amount of cleaning:  Standard   Irrigation solution:  Sterile saline Skin repair:     Repair method:  Sutures   Suture size:  4-0   Suture material:  Prolene   Suture technique:  Simple interrupted   Number of sutures:  3 Approximation:    Approximation:  Close Repair type:    Repair type:  Simple Post-procedure details:    Dressing:  Open (no dressing)   Procedure completion:  Tolerated well, no immediate complications Comments:     Dermabond applied to 2 more proximal, superficial lacerations     Medications Ordered in ED Medications  lidocaine (PF) (XYLOCAINE) 1 % injection 10 mL (has no administration in time range)  lidocaine-EPINEPHrine (XYLOCAINE W/EPI) 2 %-1:200000 (PF) injection (has no administration in time range)    ED Course/ Medical Decision Making/ A&P                             Medical Decision Making Risk Prescription drug management.   Patient is here with relatively superficial lacerations from last night.  We were able to irrigate these wounds.  The most distal wound was gaping and therefore the patient was consented for suture repair and wound closure.  The more proximal wounds were linear, well aligned, not gaping, and superficial.  These were covered with Dermabond.  The patient is stable for discharge.  No evidence of infection at this time.        Final Clinical Impression(s) / ED Diagnoses Final diagnoses:  Laceration of right forearm, initial encounter    Rx / DC Orders ED Discharge Orders     None         Nickola Lenig, Kermit Balo, MD 02/25/23 1000

## 2023-02-25 NOTE — Discharge Instructions (Signed)
Your stitches should be removed in 7-10 days.

## 2023-02-25 NOTE — ED Triage Notes (Signed)
Pt arrived POV from home. Stated he was playing with remote control airplane last night and the propellor struck his arm about 3 times. Bleeding mostly stopped, but concerned it may need stitches.

## 2023-02-25 NOTE — ED Notes (Signed)
Pt given discharge instructions. Opportunities given for questions. Pt verbalizes understanding. Samanthan Dugo R, RN 

## 2023-03-05 ENCOUNTER — Telehealth: Payer: Self-pay | Admitting: *Deleted

## 2023-03-05 NOTE — Telephone Encounter (Signed)
Transition Care Management Unsuccessful Follow-up Telephone Call  Date of discharge and from where:  [facility] 02/25/20[facility]  Attempts:  1st Attempt  Reason for unsuccessful TCM follow-up call:  Left voice message

## 2023-09-09 ENCOUNTER — Encounter: Payer: Self-pay | Admitting: Internal Medicine

## 2023-09-20 ENCOUNTER — Encounter: Payer: Self-pay | Admitting: Internal Medicine

## 2023-10-28 DIAGNOSIS — E782 Mixed hyperlipidemia: Secondary | ICD-10-CM | POA: Diagnosis not present

## 2023-10-28 DIAGNOSIS — R7301 Impaired fasting glucose: Secondary | ICD-10-CM | POA: Diagnosis not present

## 2023-10-28 DIAGNOSIS — Z79899 Other long term (current) drug therapy: Secondary | ICD-10-CM | POA: Diagnosis not present

## 2023-10-28 DIAGNOSIS — M109 Gout, unspecified: Secondary | ICD-10-CM | POA: Diagnosis not present

## 2023-10-28 DIAGNOSIS — Z125 Encounter for screening for malignant neoplasm of prostate: Secondary | ICD-10-CM | POA: Diagnosis not present

## 2023-11-04 DIAGNOSIS — M109 Gout, unspecified: Secondary | ICD-10-CM | POA: Diagnosis not present

## 2023-11-04 DIAGNOSIS — Z860101 Personal history of adenomatous and serrated colon polyps: Secondary | ICD-10-CM | POA: Diagnosis not present

## 2023-11-04 DIAGNOSIS — R82998 Other abnormal findings in urine: Secondary | ICD-10-CM | POA: Diagnosis not present

## 2023-11-04 DIAGNOSIS — E782 Mixed hyperlipidemia: Secondary | ICD-10-CM | POA: Diagnosis not present

## 2023-11-04 DIAGNOSIS — Z0000 Encounter for general adult medical examination without abnormal findings: Secondary | ICD-10-CM | POA: Diagnosis not present

## 2023-11-04 DIAGNOSIS — K219 Gastro-esophageal reflux disease without esophagitis: Secondary | ICD-10-CM | POA: Diagnosis not present

## 2023-11-04 DIAGNOSIS — R7301 Impaired fasting glucose: Secondary | ICD-10-CM | POA: Diagnosis not present

## 2023-11-04 DIAGNOSIS — Z23 Encounter for immunization: Secondary | ICD-10-CM | POA: Diagnosis not present

## 2023-11-04 DIAGNOSIS — Z1339 Encounter for screening examination for other mental health and behavioral disorders: Secondary | ICD-10-CM | POA: Diagnosis not present

## 2023-11-04 DIAGNOSIS — G3184 Mild cognitive impairment, so stated: Secondary | ICD-10-CM | POA: Diagnosis not present

## 2023-11-04 DIAGNOSIS — R911 Solitary pulmonary nodule: Secondary | ICD-10-CM | POA: Diagnosis not present

## 2023-11-04 DIAGNOSIS — I2510 Atherosclerotic heart disease of native coronary artery without angina pectoris: Secondary | ICD-10-CM | POA: Diagnosis not present

## 2023-11-04 DIAGNOSIS — Z1331 Encounter for screening for depression: Secondary | ICD-10-CM | POA: Diagnosis not present

## 2023-11-25 ENCOUNTER — Ambulatory Visit (AMBULATORY_SURGERY_CENTER): Payer: Medicare Other

## 2023-11-25 VITALS — Ht 70.5 in | Wt 200.0 lb

## 2023-11-25 DIAGNOSIS — Z860100 Personal history of colon polyps, unspecified: Secondary | ICD-10-CM

## 2023-11-25 MED ORDER — SUFLAVE 178.7 G PO SOLR
1.0000 | Freq: Once | ORAL | 0 refills | Status: AC
Start: 2023-11-25 — End: 2023-11-25

## 2023-11-25 NOTE — Progress Notes (Signed)

## 2023-12-08 ENCOUNTER — Encounter: Payer: Self-pay | Admitting: Internal Medicine

## 2023-12-08 ENCOUNTER — Ambulatory Visit: Payer: Medicare Other | Admitting: Internal Medicine

## 2023-12-08 VITALS — BP 115/80 | HR 62 | Temp 97.7°F | Resp 12 | Ht 70.5 in | Wt 200.0 lb

## 2023-12-08 DIAGNOSIS — D123 Benign neoplasm of transverse colon: Secondary | ICD-10-CM

## 2023-12-08 DIAGNOSIS — K5730 Diverticulosis of large intestine without perforation or abscess without bleeding: Secondary | ICD-10-CM

## 2023-12-08 DIAGNOSIS — Z860100 Personal history of colon polyps, unspecified: Secondary | ICD-10-CM

## 2023-12-08 DIAGNOSIS — Z860101 Personal history of adenomatous and serrated colon polyps: Secondary | ICD-10-CM

## 2023-12-08 DIAGNOSIS — K635 Polyp of colon: Secondary | ICD-10-CM | POA: Diagnosis not present

## 2023-12-08 DIAGNOSIS — D122 Benign neoplasm of ascending colon: Secondary | ICD-10-CM

## 2023-12-08 DIAGNOSIS — Z1211 Encounter for screening for malignant neoplasm of colon: Secondary | ICD-10-CM

## 2023-12-08 MED ORDER — SODIUM CHLORIDE 0.9 % IV SOLN
500.0000 mL | Freq: Once | INTRAVENOUS | Status: DC
Start: 2023-12-08 — End: 2023-12-08

## 2023-12-08 NOTE — Patient Instructions (Signed)
 Educational handout provided to patient related to Polyps and Diverticulosis  Resume previous diet  Continue present medications  Awaiting pathology results  YOU HAD AN ENDOSCOPIC PROCEDURE TODAY AT THE [facility]:   Refer to the procedure report that was given to you for any specific questions about what was found during the examination.  If the procedure report does not answer your questions, please call your gastroenterologist to clarify.  If you requested that your care partner not be given the details of your procedure findings, then the procedure report has been included in a sealed envelope for you to review at your convenience later.  YOU SHOULD EXPECT: Some feelings of bloating in the abdomen. Passage of more gas than usual.  Walking can help get rid of the air that was put into your GI tract during the procedure and reduce the bloating. If you had a lower endoscopy (such as a colonoscopy or flexible sigmoidoscopy) you may notice spotting of blood in your stool or on the toilet paper. If you underwent a bowel prep for your procedure, you may not have a normal bowel movement for a few days.  Please Note:  You might notice some irritation and congestion in your nose or some drainage.  This is from the oxygen used during your procedure.  There is no need for concern and it should clear up in a day or so.  SYMPTOMS TO REPORT IMMEDIATELY:  Following lower endoscopy (colonoscopy or flexible sigmoidoscopy):  Excessive amounts of blood in the stool  Significant tenderness or worsening of abdominal pains  Swelling of the abdomen that is new, acute  Fever of 100F or higher  For urgent or emergent issues, a gastroenterologist can be reached at any hour by calling [phone]. Do not use MyChart messaging for urgent concerns.    DIET:  We do recommend a small meal at first, but then you may proceed to your regular diet.  Drink plenty of fluids but you should avoid alcoholic  beverages for 24 hours.  ACTIVITY:  You should plan to take it easy for the rest of today and you should NOT DRIVE or use heavy machinery until tomorrow (because of the sedation medicines used during the test).    FOLLOW UP: Our staff will call the number listed on your records the next business day following your procedure.  We will call around 7:15- 8:00 am to check on you and address any questions or concerns that you may have regarding the information given to you following your procedure. If we do not reach you, we will leave a message.     If any biopsies were taken you will be contacted by phone or by letter within the next 1-3 weeks.  Please call us at [phone] if you have not heard about the biopsies in 3 weeks.    SIGNATURES/CONFIDENTIALITY: You and/or your care partner have signed paperwork which will be entered into your electronic medical record.  These signatures attest to the fact that that the information above on your After Visit Summary has been reviewed and is understood.  Full responsibility of the confidentiality of this discharge information lies with you and/or your care-partner.

## 2023-12-08 NOTE — Op Note (Signed)
[facility] Patient Name: Dylan Cortez Procedure Date: 12/08/2023 8:36 AM MRN: [id number] Endoscopist: Beverley Fiedler , MD, [id number] Age: 73 Referring MD:  Date of Birth: 03/26/1951 Gender: Male Account #: [id number] Procedure:                Colonoscopy Indications:              High risk colon cancer surveillance: Personal                            history of multiple adenomas, Last colonoscopy:                            October 2019 (TA x 2), Oct 2014 (TA x 2) Medicines:                Monitored Anesthesia Care Procedure:                Pre-Anesthesia Assessment:                           - Prior to the procedure, a History and Physical                            was performed, and patient medications and                            allergies were reviewed. The patient's tolerance of                            previous anesthesia was also reviewed. The risks                            and benefits of the procedure and the sedation                            options and risks were discussed with the patient.                            All questions were answered, and informed consent                            was obtained. Prior Anticoagulants: The patient has                            taken no anticoagulant or antiplatelet agents. ASA                            Grade Assessment: I - A normal, healthy patient.                            After reviewing the risks and benefits, the patient                            was deemed in satisfactory condition to undergo the  procedure.                           After obtaining informed consent, the colonoscope                            was passed under direct vision. Throughout the                            procedure, the patient's blood pressure, pulse, and                            oxygen saturations were monitored continuously. The                            Olympus CF-HQ190L ([id number])  Colonoscope was                            introduced through the anus and advanced to the                            cecum, identified by appendiceal orifice and                            ileocecal valve. The colonoscopy was performed                            without difficulty. The patient tolerated the                            procedure well. The quality of the bowel                            preparation was good. The ileocecal valve,                            appendiceal orifice, and rectum were photographed. Scope In: 8:45:10 AM Scope Out: 9:00:41 AM Scope Withdrawal Time: 0 hours 12 minutes 21 seconds  Total Procedure Duration: 0 hours 15 minutes 31 seconds  Findings:                 The digital rectal exam was normal.                           A 4 mm polyp was found in the ascending colon. The                            polyp was sessile. The polyp was removed with a                            cold snare. Resection and retrieval were complete.                           Two sessile polyps were found in the transverse  colon. The polyps were 4 to 5 mm in size. These                            polyps were removed with a cold snare. Resection                            and retrieval were complete.                           Multiple medium-mouthed and small-mouthed                            diverticula were found in the sigmoid colon,                            descending colon, transverse colon and ascending                            colon.                           The exam was otherwise without abnormality on                            direct and retroflexion views. Complications:            No immediate complications. Estimated Blood Loss:     Estimated blood loss was minimal. Impression:               - One 4 mm polyp in the ascending colon, removed                            with a cold snare. Resected and retrieved.                           -  Two 4 to 5 mm polyps in the transverse colon,                            removed with a cold snare. Resected and retrieved.                           - Moderate diverticulosis in the sigmoid colon, in                            the descending colon, in the transverse colon and                            in the ascending colon.                           - The examination was otherwise normal on direct                            and retroflexion views. Recommendation:           -  Patient has a contact number available for                            emergencies. The signs and symptoms of potential                            delayed complications were discussed with the                            patient. Return to normal activities tomorrow.                            Written discharge instructions were provided to the                            patient.                           - Resume previous diet.                           - Continue present medications.                           - Await pathology results.                           - Repeat colonoscopy is recommended for                            surveillance. The colonoscopy date will be                            determined after pathology results from today's                            exam become available for review. Beverley Fiedler, MD 12/08/2023 9:08:11 AM This report has been signed electronically.

## 2023-12-08 NOTE — Progress Notes (Signed)
 Sedate, gd SR, tolerated procedure well, VSS, report to RN

## 2023-12-08 NOTE — Progress Notes (Signed)
 Pt's states no medical or surgical changes since previsit or office visit.

## 2023-12-08 NOTE — Progress Notes (Signed)
 Called to room to assist during endoscopic procedure.  Patient ID and intended procedure confirmed with present staff. Received instructions for my participation in the procedure from the performing physician.

## 2023-12-08 NOTE — Progress Notes (Signed)
GASTROENTEROLOGY PROCEDURE H&P NOTE   Primary Care Physician: Cleatis Polka., MD    Reason for Procedure:  History of adenomatous polyps  Plan:    Colonoscopy  Patient is appropriate for endoscopic procedure(s) in the ambulatory ([facility]) setting.  The nature of the procedure, as well as the risks, benefits, and alternatives were carefully and thoroughly reviewed with the patient. Ample time for discussion and questions allowed. The patient understood, was satisfied, and agreed to proceed.     HPI: Dylan Cortez is a 73 y.o. male who presents for colonoscopy.  Medical history as below.  Tolerated the prep.  No recent chest pain or shortness of breath.  No abdominal pain today.  Past Medical History:  Diagnosis Date   Arthritis    Colon polyps    GERD (gastroesophageal reflux disease)    Gout    Hyperlipidemia    Shingles     Past Surgical History:  Procedure Laterality Date   COSMETIC SURGERY     left cheek   KNEE ARTHROSCOPY     left   KNEE SURGERY Right    SHOULDER ARTHROSCOPY     left   sinus surgeries     TONSILLECTOMY      Prior to Admission medications   Medication Sig Start Date End Date Taking? Authorizing Provider  allopurinol (ZYLOPRIM) 300 MG tablet Take 300 mg by mouth daily.   Yes [provider]  atorvastatin (LIPITOR) 20 MG tablet Take 20 mg by mouth daily.   Yes [provider]  Coenzyme Q10 (CO Q 10) 100 MG CAPS Take 1 capsule by mouth daily. 08/10/19  Yes [provider]  omeprazole (PRILOSEC) 40 MG capsule Take 40 mg by mouth daily.   Yes [provider]  aspirin EC 81 MG tablet Take 1 tablet (81 mg total) by mouth daily. Swallow whole. 10/10/21   Kathleene Hazel, MD  ibuprofen (ADVIL,MOTRIN) 200 MG tablet Take 200 mg by mouth every 6 (six) hours as needed for pain.    [provider]    Current Outpatient Medications  Medication Sig Dispense Refill   allopurinol (ZYLOPRIM) 300  MG tablet Take 300 mg by mouth daily.     atorvastatin (LIPITOR) 20 MG tablet Take 20 mg by mouth daily.     Coenzyme Q10 (CO Q 10) 100 MG CAPS Take 1 capsule by mouth daily.     omeprazole (PRILOSEC) 40 MG capsule Take 40 mg by mouth daily.     aspirin EC 81 MG tablet Take 1 tablet (81 mg total) by mouth daily. Swallow whole. 90 tablet 3   ibuprofen (ADVIL,MOTRIN) 200 MG tablet Take 200 mg by mouth every 6 (six) hours as needed for pain.     Current Facility-Administered Medications  Medication Dose Route Frequency Provider Last Rate Last Admin   0.9 %  sodium chloride infusion  500 mL Intravenous Once Katessa Attridge, Carie Caddy, MD        Allergies as of 12/08/2023   (No Known Allergies)    Family History  Problem Relation Age of Onset   Hypertension Mother    Diabetes Mother    Dementia Mother    Heart attack Father 7   Colon cancer Neg Hx    Pancreatic cancer Neg Hx    Stomach cancer Neg Hx    Esophageal cancer Neg Hx    Colon polyps Neg Hx    Rectal cancer Neg Hx     Social History  Socioeconomic History   Marital status: Married    Spouse name: Not on file   Number of children: 2   Years of education: Not on file   Highest education level: Not on file  Occupational History   Occupation: Retired Clinical cytogeneticist of [employer]  Tobacco Use   Smoking status: Never   Smokeless tobacco: Current  Vaping Use   Vaping status: Never Used  Substance and Sexual Activity   Alcohol use: Yes    Alcohol/week: 7.0 standard drinks of alcohol    Types: 7 Glasses of wine per week    Comment: 7 glasses of wine per day   Drug use: Never   Sexual activity: Not on file  Other Topics Concern   Not on file  Social History Narrative   Not on file   Social Drivers of Health   Financial Resource Strain: Not on file  Food Insecurity: Not on file  Transportation Needs: Not on file  Physical Activity: Not on file  Stress: Not on file  Social Connections: Not on file   Intimate Partner Violence: Not on file    Physical Exam: Vital signs in last 24 hours: @BP  112/67   Pulse 69   Temp 97.7 F (36.5 C) (Temporal)   Ht 5' 10.5" (1.791 m)   Wt 200 lb (90.7 kg)   SpO2 97%   BMI 28.29 kg/m  GEN: NAD EYE: Sclerae anicteric ENT: MMM CV: Non-tachycardic Pulm: CTA b/l GI: Soft, NT/ND NEURO:  Alert & Oriented x 3   Erick Blinks, MD [facility]  12/08/2023 8:33 AM

## 2023-12-09 ENCOUNTER — Telehealth: Payer: Self-pay

## 2023-12-09 NOTE — Telephone Encounter (Signed)
 Left message on follow up call.

## 2023-12-10 ENCOUNTER — Encounter: Payer: Self-pay | Admitting: Internal Medicine

## 2023-12-10 LAB — SURGICAL PATHOLOGY

## 2024-02-09 DIAGNOSIS — L570 Actinic keratosis: Secondary | ICD-10-CM | POA: Diagnosis not present

## 2024-02-09 DIAGNOSIS — D1801 Hemangioma of skin and subcutaneous tissue: Secondary | ICD-10-CM | POA: Diagnosis not present

## 2024-02-09 DIAGNOSIS — L80 Vitiligo: Secondary | ICD-10-CM | POA: Diagnosis not present

## 2024-02-09 DIAGNOSIS — L821 Other seborrheic keratosis: Secondary | ICD-10-CM | POA: Diagnosis not present

## 2024-02-09 DIAGNOSIS — L820 Inflamed seborrheic keratosis: Secondary | ICD-10-CM | POA: Diagnosis not present

## 2024-02-09 DIAGNOSIS — D485 Neoplasm of uncertain behavior of skin: Secondary | ICD-10-CM | POA: Diagnosis not present

## 2024-03-21 DIAGNOSIS — D485 Neoplasm of uncertain behavior of skin: Secondary | ICD-10-CM | POA: Diagnosis not present

## 2024-03-21 DIAGNOSIS — C44519 Basal cell carcinoma of skin of other part of trunk: Secondary | ICD-10-CM | POA: Diagnosis not present

## 2024-03-21 DIAGNOSIS — L820 Inflamed seborrheic keratosis: Secondary | ICD-10-CM | POA: Diagnosis not present

## 2024-03-21 DIAGNOSIS — L570 Actinic keratosis: Secondary | ICD-10-CM | POA: Diagnosis not present

## 2024-07-27 DIAGNOSIS — Z1152 Encounter for screening for COVID-19: Secondary | ICD-10-CM | POA: Diagnosis not present

## 2024-07-27 DIAGNOSIS — R0981 Nasal congestion: Secondary | ICD-10-CM | POA: Diagnosis not present

## 2024-07-27 DIAGNOSIS — J029 Acute pharyngitis, unspecified: Secondary | ICD-10-CM | POA: Diagnosis not present

## 2024-07-27 DIAGNOSIS — R5383 Other fatigue: Secondary | ICD-10-CM | POA: Diagnosis not present

## 2024-07-27 DIAGNOSIS — R062 Wheezing: Secondary | ICD-10-CM | POA: Diagnosis not present

## 2024-07-27 DIAGNOSIS — R058 Other specified cough: Secondary | ICD-10-CM | POA: Diagnosis not present

## 2024-08-28 DIAGNOSIS — M25552 Pain in left hip: Secondary | ICD-10-CM | POA: Diagnosis not present

## 2024-08-28 DIAGNOSIS — M5459 Other low back pain: Secondary | ICD-10-CM | POA: Diagnosis not present

## 2024-08-31 DIAGNOSIS — M25552 Pain in left hip: Secondary | ICD-10-CM | POA: Diagnosis not present

## 2024-08-31 DIAGNOSIS — M5459 Other low back pain: Secondary | ICD-10-CM | POA: Diagnosis not present

## 2024-09-04 DIAGNOSIS — M25552 Pain in left hip: Secondary | ICD-10-CM | POA: Diagnosis not present

## 2024-09-09 DIAGNOSIS — M5459 Other low back pain: Secondary | ICD-10-CM | POA: Diagnosis not present

## 2024-09-09 DIAGNOSIS — M545 Low back pain, unspecified: Secondary | ICD-10-CM | POA: Diagnosis not present

## 2024-09-19 DIAGNOSIS — M5459 Other low back pain: Secondary | ICD-10-CM | POA: Diagnosis not present

## 2024-09-19 DIAGNOSIS — M25552 Pain in left hip: Secondary | ICD-10-CM | POA: Diagnosis not present

## 2024-09-21 DIAGNOSIS — M5416 Radiculopathy, lumbar region: Secondary | ICD-10-CM | POA: Diagnosis not present

## 2024-09-28 DIAGNOSIS — M25552 Pain in left hip: Secondary | ICD-10-CM | POA: Diagnosis not present

## 2024-09-28 DIAGNOSIS — M5459 Other low back pain: Secondary | ICD-10-CM | POA: Diagnosis not present

## 2024-11-22 ENCOUNTER — Other Ambulatory Visit (HOSPITAL_COMMUNITY): Payer: Self-pay | Admitting: Medical

## 2024-11-22 ENCOUNTER — Ambulatory Visit (HOSPITAL_COMMUNITY)
Admission: RE | Admit: 2024-11-22 | Discharge: 2024-11-22 | Disposition: A | Source: Ambulatory Visit | Attending: Vascular Surgery

## 2024-11-22 DIAGNOSIS — R600 Localized edema: Secondary | ICD-10-CM
